# Patient Record
Sex: Male | Born: 2000 | Race: White | Hispanic: No | Marital: Single | Smoking: Never smoker
Health system: Southern US, Community
[De-identification: ages and names within clinical notes are randomized; demographics above are authoritative.]

## PROBLEM LIST (undated history)

## (undated) DIAGNOSIS — F329 Major depressive disorder, single episode, unspecified: Secondary | ICD-10-CM

## (undated) HISTORY — PX: NO PAST SURGERIES: SHX2092

---
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
## 2018-12-30 ENCOUNTER — Ambulatory Visit: Payer: BLUE CROSS/BLUE SHIELD | Admitting: Sports Medicine

## 2018-12-31 ENCOUNTER — Ambulatory Visit: Payer: BLUE CROSS/BLUE SHIELD | Admitting: Sports Medicine

## 2018-12-31 ENCOUNTER — Encounter: Payer: Self-pay | Admitting: Sports Medicine

## 2018-12-31 DIAGNOSIS — F32A Depression, unspecified: Secondary | ICD-10-CM | POA: Insufficient documentation

## 2018-12-31 DIAGNOSIS — F419 Anxiety disorder, unspecified: Secondary | ICD-10-CM

## 2018-12-31 DIAGNOSIS — F329 Major depressive disorder, single episode, unspecified: Secondary | ICD-10-CM | POA: Diagnosis not present

## 2018-12-31 HISTORY — DX: Anxiety disorder, unspecified: F41.9

## 2018-12-31 HISTORY — DX: Depression, unspecified: F32.A

## 2018-12-31 MED ORDER — SERTRALINE HCL 25 MG PO TABS: 25.0000 mg | ORAL_TABLET | Freq: Every day | ORAL | 2 refills | Status: DC

## 2018-12-31 NOTE — Assessment & Plan Note (Addendum)
We discussed in detail the pathophysiology of depression, the mechanism of SSRIs. And behavioral therapy, Zoloft 25. Return to see me in 1 month.

## 2018-12-31 NOTE — Progress Notes (Signed)
  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  Over the past few months he is noted increasing symptoms of depressed mood, uneasiness, anxiety.  No suicidal or homicidal ideation.  Symptoms are moderate, persistent.  He is agreeable to consider behavioral therapy and pharmacotherapy.  His mother accompanies him in the appointment.  I reviewed the past medical history, family history, social history, surgical history, and allergies today and no changes were needed.  Please see the problem list section below in epic for further details.  Past Medical History: No past medical history on file. Past Surgical History: No past surgical history on file. Social History: Social History   Socioeconomic History  . Marital status: Single    Spouse name: Not on file  . Number of children: Not on file  . Years of education: Not on file  . Highest education level: Not on file  Occupational History  . Not on file  Social Needs  . Financial resource strain: Not on file  . Food insecurity:    Worry: Not on file    Inability: Not on file  . Transportation needs:    Medical: Not on file    Non-medical: Not on file  Tobacco Use  . Smoking status: Never Smoker  . Smokeless tobacco: Never Used  Substance and Sexual Activity  . Alcohol use: No  . Drug use: No  . Sexual activity: Not on file  Lifestyle  . Physical activity:    Days per week: Not on file    Minutes per session: Not on file  . Stress: Not on file  Relationships  . Social connections:    Talks on phone: Not on file    Gets together: Not on file    Attends religious service: Not on file    Active member of club or organization: Not on file    Attends meetings of clubs or organizations: Not on file    Relationship status: Not on file  Other Topics Concern  . Not on file  Social History Narrative  . Not on file   Family  History: No family history on file. Allergies: No Known Allergies Medications: See med rec.  Review of Systems: No fevers, chills, night sweats, weight loss, chest pain, or shortness of breath.   Objective:    General: Well Developed, well nourished, and in no acute distress.  Neuro: Alert and oriented x3, extra-ocular muscles intact, sensation grossly intact.  HEENT: Normocephalic, atraumatic, pupils equal round reactive to light, neck supple, no masses, no lymphadenopathy, thyroid nonpalpable.  Skin: Warm and dry, no rashes. Cardiac: Regular rate and rhythm, no murmurs rubs or gallops, no lower extremity edema.  Respiratory: Clear to auscultation bilaterally. Not using accessory muscles, speaking in full sentences.  Impression and Recommendations:    Anxiety and depression We discussed in detail the pathophysiology of depression, the mechanism of SSRIs. And behavioral therapy, Zoloft 25. Return to see me in 1 month.  ___________________________________________ Ihor Austin. Jonathan Moreno, M.D., ABFM., CAQSM. Primary Care and Sports Medicine [facility]  Adjunct Professor of Family Medicine  [employer]

## 2019-01-30 ENCOUNTER — Ambulatory Visit (INDEPENDENT_AMBULATORY_CARE_PROVIDER_SITE_OTHER): Payer: BLUE CROSS/BLUE SHIELD | Admitting: Sports Medicine

## 2019-01-30 ENCOUNTER — Other Ambulatory Visit: Payer: Self-pay

## 2019-01-30 DIAGNOSIS — F329 Major depressive disorder, single episode, unspecified: Secondary | ICD-10-CM | POA: Diagnosis not present

## 2019-01-30 DIAGNOSIS — F32A Depression, unspecified: Secondary | ICD-10-CM

## 2019-01-30 DIAGNOSIS — F419 Anxiety disorder, unspecified: Secondary | ICD-10-CM

## 2019-01-30 MED ORDER — SERTRALINE HCL 50 MG PO TABS: 50.0000 mg | ORAL_TABLET | Freq: Every day | ORAL | 3 refills | Status: DC

## 2019-01-30 NOTE — Progress Notes (Signed)
Virtual Visit via WebEx   I connected with  Jonathan Moreno  on 01/30/19 via WebEx and verified that I am speaking with the correct person using two identifiers.   I discussed the limitations, risks, security and privacy concerns of performing an evaluation and management service by WebEx, including the higher likelihood of inaccurate diagnosis and treatment, and the availability of in person appointments.  We also discussed the likely need of an additional face to face encounter for complete and high quality delivery of care.  I also discussed with the patient that there may be a patient responsible charge related to this service. The patient expressed understanding and wishes to proceed.  Subjective:    CC: Follow-up anxiety and depression  HPI: Jonathan Moreno is a pleasant 18 year old male, he is in college.  We have been treating him for anxiety and depression, he did really well with 25 of sertraline.  Good improvements in depressive symptoms but persistent uneasiness and anxiety.  This is complicated with the passing of his grandfather recently as well as being out of school due to the COVID-19 drama.  He denies suicidal or homicidal ideation.  I reviewed the past medical history, family history, social history, surgical history, and allergies today and no changes were needed.  Please see the problem list section below in epic for further details.  Past Medical History: No past medical history on file. Past Surgical History: No past surgical history on file. Social History: Social History   Socioeconomic History  . Marital status: Single    Spouse name: Not on file  . Number of children: Not on file  . Years of education: Not on file  . Highest education level: Not on file  Occupational History  . Not on file  Social Needs  . Financial resource strain: Not on file  . Food insecurity:    Worry: Not on file    Inability: Not on file  . Transportation needs:    Medical: Not on file    Non-medical: Not on file  Tobacco Use  . Smoking status: Never Smoker  . Smokeless tobacco: Never Used  Substance and Sexual Activity  . Alcohol use: No  . Drug use: No  . Sexual activity: Not on file  Lifestyle  . Physical activity:    Days per week: Not on file    Minutes per session: Not on file  . Stress: Not on file  Relationships  . Social connections:    Talks on phone: Not on file    Gets together: Not on file    Attends religious service: Not on file    Active member of club or organization: Not on file    Attends meetings of clubs or organizations: Not on file    Relationship status: Not on file  Other Topics Concern  . Not on file  Social History Narrative  . Not on file   Family History: No family history on file. Allergies: No Known Allergies Medications: See med rec.  Review of Systems: No fevers, chills, night sweats, weight loss, chest pain, or shortness of breath.   Objective:    General: Speaking full sentences, no audible heavy breathing.  Sounds alert and appropriately interactive.  Appears well.  Face symmetric.  Extraocular movements intact.  Pupils equal and round.  No nasal flaring or accessory muscle use visualized.  No other physical exam performed due to the non-physical nature of this visit.  Impression and Recommendations:    Anxiety and  depression Doing significantly better on Zoloft 25. He still has some anxiety symptoms, depression is feeling much better controlled. No suicidal or homicidal ideation. Increasing Zoloft to 50 mg, he did have some recent stressors including the passing of his grandfather. School is out due to COVID-19. I have also advised him to get out and do at least 30 minutes of moderate intensity aerobic activity at least daily since he is out of school. WebEx visit in 1 month for repeat PHQ and GAD.  I discussed the above assessment and treatment plan with the patient. The patient was provided an opportunity to ask  questions and all were answered. The patient agreed with the plan and demonstrated an understanding of the instructions.   The patient was advised to call back or seek an in-person evaluation if the symptoms worsen or if the condition fails to improve as anticipated.   I provided 20 minutes of electronic video evaluation time during this encounter, less than 50% was time needed to gather information, review chart and records, explain the treatment plan to the patient, and complete documentation.   ___________________________________________ Ihor Austin. Benjamin Stain, M.D., ABFM., CAQSM. Primary Care and Sports Medicine [facility]  Adjunct Professor of Family Medicine  [employer]

## 2019-01-30 NOTE — Assessment & Plan Note (Signed)
Doing significantly better on Zoloft 25. He still has some anxiety symptoms, depression is feeling much better controlled. No suicidal or homicidal ideation. Increasing Zoloft to 50 mg, he did have some recent stressors including the passing of his grandfather. School is out due to COVID-19. I have also advised him to get out and do at least 30 minutes of moderate intensity aerobic activity at least daily since he is out of school. WebEx visit in 1 month for repeat PHQ and GAD.

## 2019-03-03 ENCOUNTER — Telehealth (INDEPENDENT_AMBULATORY_CARE_PROVIDER_SITE_OTHER): Payer: BLUE CROSS/BLUE SHIELD | Admitting: Sports Medicine

## 2019-03-03 ENCOUNTER — Encounter: Payer: Self-pay | Admitting: Sports Medicine

## 2019-03-03 DIAGNOSIS — F329 Major depressive disorder, single episode, unspecified: Secondary | ICD-10-CM | POA: Diagnosis not present

## 2019-03-03 DIAGNOSIS — F419 Anxiety disorder, unspecified: Secondary | ICD-10-CM

## 2019-03-03 MED ORDER — BUSPIRONE HCL 5 MG PO TABS: 5.0000 mg | ORAL_TABLET | Freq: Three times a day (TID) | ORAL | 2 refills | Status: DC

## 2019-03-03 NOTE — Progress Notes (Signed)
Virtual Visit via WebEx/MyChart   I connected with  Jonathan Moreno  on 03/03/19 via WebEx/MyChart/Doximity Video and verified that I am speaking with the correct person using two identifiers.   I discussed the limitations, risks, security and privacy concerns of performing an evaluation and management service by WebEx/MyChart/Doximity Video, including the higher likelihood of inaccurate diagnosis and treatment, and the availability of in person appointments.  We also discussed the likely need of an additional face to face encounter for complete and high quality delivery of care.  I also discussed with the patient that there may be a patient responsible charge related to this service. The patient expressed understanding and wishes to proceed.  Provider location is either at home or medical facility. Patient location is at their home, different from provider location. People involved in care of the patient during this telehealth encounter were myself, my nurse/medical assistant, and my front office/scheduling team member.  Subjective:    CC: Follow-up  HPI: This is a pleasant 18 year old male, we have been treating him for anxiety and depression, at the last visit we bumped him up to 50 mg of Zoloft, he feels as though his depressive symptoms have resolved, he still gets significant anxiety on a daily basis, has a constant, baseline level of feeling uncomfortable.  He is agreeable to add behavioral therapy to the treatment regimen as well as add something for anxiety.  He did not seem excited to go up on the Zoloft dose.  No suicidal or homicidal ideation.  He was at [employer], they are still not started back.  I reviewed the past medical history, family history, social history, surgical history, and allergies today and no changes were needed.  Please see the problem list section below in epic for further details.  Past Medical History: No past medical history on file. Past Surgical  History: No past surgical history on file. Social History: Social History   Socioeconomic History  . Marital status: Single    Spouse name: Not on file  . Number of children: Not on file  . Years of education: Not on file  . Highest education level: Not on file  Occupational History  . Not on file  Social Needs  . Financial resource strain: Not on file  . Food insecurity:    Worry: Not on file    Inability: Not on file  . Transportation needs:    Medical: Not on file    Non-medical: Not on file  Tobacco Use  . Smoking status: Never Smoker  . Smokeless tobacco: Never Used  Substance and Sexual Activity  . Alcohol use: No  . Drug use: No  . Sexual activity: Not on file  Lifestyle  . Physical activity:    Days per week: Not on file    Minutes per session: Not on file  . Stress: Not on file  Relationships  . Social connections:    Talks on phone: Not on file    Gets together: Not on file    Attends religious service: Not on file    Active member of club or organization: Not on file    Attends meetings of clubs or organizations: Not on file    Relationship status: Not on file  Other Topics Concern  . Not on file  Social History Narrative  . Not on file   Family History: No family history on file. Allergies: No Known Allergies Medications: See med rec.  Review of Systems: No fevers,  chills, night sweats, weight loss, chest pain, or shortness of breath.   Objective:    General: Speaking full sentences, no audible heavy breathing.  Sounds alert and appropriately interactive.  Appears well.  Face symmetric.  Extraocular movements intact.  Pupils equal and round.  No nasal flaring or accessory muscle use visualized.  No other physical exam performed due to the non-physical nature of this visit.  Impression and Recommendations:    Anxiety and depression Depressive symptoms improved further on Zoloft 50 mg, he tells me his depressive symptoms have essentially  resolved. He does still get anxiety symptoms, daily basis, no panic, but a baseline level of uneasiness. He does not really want to go up on the Zoloft, I am going to add BuSpar 5 mg 3 times daily. I would also like to set him up with [facility] for a virtual visit.  I discussed the above assessment and treatment plan with the patient. The patient was provided an opportunity to ask questions and all were answered. The patient agreed with the plan and demonstrated an understanding of the instructions.   The patient was advised to call back or seek an in-person evaluation if the symptoms worsen or if the condition fails to improve as anticipated.   I provided 25 minutes of non-face-to-face time during this encounter, 15 minutes of additional time was needed to gather information, review chart, records, communicate/coordinate with staff remotely, troubleshooting the multiple errors that we get every time when trying to do video calls through the electronic medical record, WebEx, and Doximity, restart the encounter multiple times due to instability of the software, as well as complete documentation.   ___________________________________________ Ihor Austin. Benjamin Stain, M.D., ABFM., CAQSM. Primary Care and Sports Medicine [facility]  Adjunct Professor of Family Medicine  [employer]

## 2019-03-03 NOTE — Assessment & Plan Note (Signed)
Depressive symptoms improved further on Zoloft 50 mg, he tells me his depressive symptoms have essentially resolved. He does still get anxiety symptoms, daily basis, no panic, but a baseline level of uneasiness. He does not really want to go up on the Zoloft, I am going to add BuSpar 5 mg 3 times daily. I would also like to set him up with [facility] for a virtual visit.

## 2019-05-28 ENCOUNTER — Other Ambulatory Visit: Payer: Self-pay

## 2019-05-28 ENCOUNTER — Encounter: Payer: Self-pay | Admitting: Family Medicine

## 2019-05-28 ENCOUNTER — Ambulatory Visit (INDEPENDENT_AMBULATORY_CARE_PROVIDER_SITE_OTHER): Payer: BC Managed Care – PPO | Admitting: Family Medicine

## 2019-05-28 VITALS — BP 104/62 | HR 59 | Temp 97.8°F | Wt 192.0 lb

## 2019-05-28 DIAGNOSIS — L0291 Cutaneous abscess, unspecified: Secondary | ICD-10-CM

## 2019-05-28 MED ORDER — DOXYCYCLINE HYCLATE 100 MG PO TABS: 100.0000 mg | ORAL_TABLET | Freq: Two times a day (BID) | ORAL | 0 refills | Status: DC

## 2019-05-28 NOTE — Progress Notes (Signed)
       [place] Moreno is a 18 y.o. male who presents to [facility]: [facility] today for abscess.  Patient had a small bump on the right medial thigh near his groin about a month ago that resolved spontaneously.  Still about a week ago the bump return to become red and painful.  He is able to get it to drain yesterday and this is a lot better today.  Is not draining.  He denies significant pain.  No fevers chills nausea vomiting or diarrhea.   ROS as above:  Exam:  BP 104/62   Pulse (!) 59   Temp 97.8 F (36.6 C) (Oral)   Wt 192 lb (87.1 kg)   BMI 26.41 kg/m  Wt Readings from Last 5 Encounters:  05/28/19 192 lb (87.1 kg)  12/31/18 182 lb (82.6 kg)  05/24/17 178 lb (80.7 kg) (83 %, Z= 0.96)*  09/25/16 179 lb (81.2 kg) (86 %, Z= 1.08)*  08/18/16 174 lb 11.2 oz (79.2 kg) (84 %, Z= 0.98)*   * Growth percentiles are based on CDC (Boys, 2-20 Years) data.    Gen: Well NAD HEENT: EOMI,  MMM Lungs: Normal work of breathing. CTABL Heart: RRR no MRG Abd: NABS, Soft. Nondistended, Nontender Exts: Brisk capillary refill, warm and well perfused. Skin: Right medial thigh small soft area with mild erythema.  Minimally tender.  No expressible pus.  Lab and Radiology Results No results found for this or any previous visit (from the past 72 hour(s)). No results found.    Assessment and Plan: 18 y.o. male with abscess right medial thigh.  Drained spontaneously.  No current fluid collection.  Discussed options.  Plan for trial of oral doxycycline.  If it returns will return to clinic for further incision and drainage however right now is very small and I think we can get away with just oral antibiotics.  Discussed shocking precautions including sun protection and avoiding calcium and iron.  PDMP not reviewed this encounter. No orders of the defined types were placed in this encounter.  Meds  ordered this encounter  Medications  . doxycycline (VIBRA-TABS) 100 MG tablet    Sig: Take 1 tablet (100 mg total) by mouth 2 (two) times daily.    Dispense:  14 tablet    Refill:  0     Historical information moved to improve visibility of documentation.  Past Medical History:  Diagnosis Date  . Anxiety and depression 12/31/2018   12/31/2018 PHQ = 16, GAD 7 = 16 01/30/2019 PHQ = 9, GAD 7 = 13 03/03/2019 PHQ = 5, GAD 7 = 18   No past surgical history on file. Social History   Tobacco Use  . Smoking status: Never Smoker  . Smokeless tobacco: Never Used  Substance Use Topics  . Alcohol use: No   family history is not on file.  Medications: Current Outpatient Medications  Medication Sig Dispense Refill  . busPIRone (BUSPAR) 5 MG tablet Take 1 tablet (5 mg total) by mouth 3 (three) times daily. 90 tablet 2  . doxycycline (VIBRA-TABS) 100 MG tablet Take 1 tablet (100 mg total) by mouth 2 (two) times daily. 14 tablet 0  . sertraline (ZOLOFT) 50 MG tablet Take 1 tablet (50 mg total) by mouth daily. 30 tablet 3   No current facility-administered medications for this visit.    No Known Allergies   Discussed warning signs or symptoms. Please see discharge instructions. Patient expresses understanding.

## 2019-05-28 NOTE — Patient Instructions (Signed)
Thank you for coming in today. Take doxycycline twice daily for 1 week.  Use warm compress if it starts to return.  If it just come back return to clinic and we will drain it.  Recheck as needed.  Make sure to use sun protection and avoid calcium and iron with doxycycline.    Skin Abscess  A skin abscess is an infected area on or under your skin that contains a collection of pus and other material. An abscess may also be called a furuncle, carbuncle, or boil. An abscess can occur in or on almost any part of your body. Some abscesses break open (rupture) on their own. Most continue to get worse unless they are treated. The infection can spread deeper into the body and eventually into your blood, which can make you feel ill. Treatment usually involves draining the abscess. What are the causes? An abscess occurs when germs, like bacteria, pass through your skin and cause an infection. This may be caused by:  A scrape or cut on your skin.  A puncture wound through your skin, including a needle injection or insect bite.  Blocked oil or sweat glands.  Blocked and infected hair follicles.  A cyst that forms beneath your skin (sebaceous cyst) and becomes infected. What increases the risk? This condition is more likely to develop in people who:  Have a weak body defense system (immune system).  Have diabetes.  Have dry and irritated skin.  Get frequent injections or use illegal IV drugs.  Have a foreign body in a wound, such as a splinter.  Have problems with their lymph system or veins. What are the signs or symptoms? Symptoms of this condition include:  A painful, firm bump under the skin.  A bump with pus at the top. This may break through the skin and drain. Other symptoms include:  Redness surrounding the abscess site.  Warmth.  Swelling of the lymph nodes (glands) near the abscess.  Tenderness.  A sore on the skin. How is this diagnosed? This condition may be  diagnosed based on:  A physical exam.  Your medical history.  A sample of pus. This may be used to find out what is causing the infection.  Blood tests.  Imaging tests, such as an ultrasound, CT scan, or MRI. How is this treated? A small abscess that drains on its own may not need treatment. Treatment for larger abscesses may include:  Moist heat or heat pack applied to the area several times a day.  A procedure to drain the abscess (incision and drainage).  Antibiotic medicines. For a severe abscess, you may first get antibiotics through an IV and then change to antibiotics by mouth. Follow these instructions at home: Medicines   Take over-the-counter and prescription medicines only as told by your health care provider.  If you were prescribed an antibiotic medicine, take it as told by your health care provider. Do not stop taking the antibiotic even if you start to feel better. Abscess care   If you have an abscess that has not drained, apply heat to the affected area. Use the heat source that your health care provider recommends, such as a moist heat pack or a heating pad. ? Place a towel between your skin and the heat source. ? Leave the heat on for 20-30 minutes. ? Remove the heat if your skin turns bright red. This is especially important if you are unable to feel pain, heat, or cold. You may have a  greater risk of getting burned.  Follow instructions from your health care provider about how to take care of your abscess. Make sure you: ? Cover the abscess with a bandage (dressing). ? Change your dressing or gauze as told by your health care provider. ? Wash your hands with soap and water before you change the dressing or gauze. If soap and water are not available, use hand sanitizer.  Check your abscess every day for signs of a worsening infection. Check for: ? More redness, swelling, or pain. ? More fluid or blood. ? Warmth. ? More pus or a bad smell. General  instructions  To avoid spreading the infection: ? Do not share personal care items, towels, or hot tubs with others. ? Avoid making skin contact with other people.  Keep all follow-up visits as told by your health care provider. This is important. Contact a health care provider if you have:  More redness, swelling, or pain around your abscess.  More fluid or blood coming from your abscess.  Warm skin around your abscess.  More pus or a bad smell coming from your abscess.  A fever.  Muscle aches.  Chills or a general ill feeling. Get help right away if you:  Have severe pain.  See red streaks on your skin spreading away from the abscess. Summary  A skin abscess is an infected area on or under your skin that contains a collection of pus and other material.  A small abscess that drains on its own may not need treatment.  Treatment for larger abscesses may include having a procedure to drain the abscess and taking an antibiotic. This information is not intended to replace advice given to you by your health care provider. Make sure you discuss any questions you have with your health care provider. Document Released: 07/26/2005 Document Revised: 02/06/2019 Document Reviewed: 11/29/2017 Elsevier Patient Education  2020 [employer].

## 2019-09-23 ENCOUNTER — Telehealth: Payer: Self-pay | Admitting: Sports Medicine

## 2019-09-23 ENCOUNTER — Ambulatory Visit: Payer: BC Managed Care – PPO | Admitting: Sports Medicine

## 2019-09-23 NOTE — Telephone Encounter (Signed)
Patient mom and patient is aware. Appointment has been scheduled. -HSM.

## 2019-09-23 NOTE — Telephone Encounter (Signed)
Patient's mom called and the patient is having some back pain. He reports that it started Saturday. He has taken some Ibuprofen and is not helping much. He was offered an appointment for next week and does not want to wait that long. I offered Urgent Care or ER and patient and mom were insistent on seeing you. Please advise.

## 2019-09-23 NOTE — Telephone Encounter (Signed)
I think we can book him somewhere tomorrow, may be 115?

## 2019-09-24 ENCOUNTER — Ambulatory Visit (INDEPENDENT_AMBULATORY_CARE_PROVIDER_SITE_OTHER): Payer: BC Managed Care – PPO

## 2019-09-24 ENCOUNTER — Other Ambulatory Visit: Payer: Self-pay

## 2019-09-24 ENCOUNTER — Encounter: Payer: Self-pay | Admitting: Sports Medicine

## 2019-09-24 ENCOUNTER — Ambulatory Visit: Payer: BC Managed Care – PPO | Admitting: Sports Medicine

## 2019-09-24 ENCOUNTER — Ambulatory Visit (INDEPENDENT_AMBULATORY_CARE_PROVIDER_SITE_OTHER): Payer: BC Managed Care – PPO | Admitting: Sports Medicine

## 2019-09-24 DIAGNOSIS — M545 Low back pain: Secondary | ICD-10-CM | POA: Diagnosis not present

## 2019-09-24 DIAGNOSIS — M5416 Radiculopathy, lumbar region: Secondary | ICD-10-CM

## 2019-09-24 MED ORDER — PREDNISONE 50 MG PO TABS: ORAL_TABLET | ORAL | 0 refills | Status: DC

## 2019-09-24 MED ORDER — MELOXICAM 15 MG PO TABS: ORAL_TABLET | ORAL | 3 refills | Status: DC

## 2019-09-24 NOTE — Assessment & Plan Note (Signed)
Axial discogenic back pain with left-sided L4 radicular symptoms. X-rays, prednisone, meloxicam, formal PT. Return to see me in 4 weeks, MR for interventional planning if no better.

## 2019-09-24 NOTE — Progress Notes (Signed)
Subjective:    CC: Back pain  HPI: For a few weeks after doing some yard work.  Has had pain that he localizes in the left lower lumbar spine with radiation over to the anterolateral left thigh but not past the knee.  Worse with sitting, flexion, Valsalva, no bowel or bladder dysfunction, saddle numbness, constitutional symptoms, trauma.  I reviewed the past medical history, family history, social history, surgical history, and allergies today and no changes were needed.  Please see the problem list section below in epic for further details.  Past Medical History: Past Medical History:  Diagnosis Date  . Anxiety and depression 12/31/2018   12/31/2018 PHQ = 16, GAD 7 = 16 01/30/2019 PHQ = 9, GAD 7 = 13 03/03/2019 PHQ = 5, GAD 7 = 18   Past Surgical History: No past surgical history on file. Social History: Social History   Socioeconomic History  . Marital status: Single    Spouse name: Not on file  . Number of children: Not on file  . Years of education: Not on file  . Highest education level: Not on file  Occupational History  . Not on file  Social Needs  . Financial resource strain: Not on file  . Food insecurity    Worry: Not on file    Inability: Not on file  . Transportation needs    Medical: Not on file    Non-medical: Not on file  Tobacco Use  . Smoking status: Never Smoker  . Smokeless tobacco: Never Used  Substance and Sexual Activity  . Alcohol use: No  . Drug use: No  . Sexual activity: Not on file  Lifestyle  . Physical activity    Days per week: Not on file    Minutes per session: Not on file  . Stress: Not on file  Relationships  . Social Herbalist on phone: Not on file    Gets together: Not on file    Attends religious service: Not on file    Active member of club or organization: Not on file    Attends meetings of clubs or organizations: Not on file    Relationship status: Not on file  Other Topics Concern  . Not on file  Social History  Narrative  . Not on file   Family History: No family history on file. Allergies: No Known Allergies Medications: See med rec.  Review of Systems: No fevers, chills, night sweats, weight loss, chest pain, or shortness of breath.   Objective:    General: Well Developed, well nourished, and in no acute distress.  Neuro: Alert and oriented x3, extra-ocular muscles intact, sensation grossly intact.  HEENT: Normocephalic, atraumatic, pupils equal round reactive to light, neck supple, no masses, no lymphadenopathy, thyroid nonpalpable.  Skin: Warm and dry, no rashes. Cardiac: Regular rate and rhythm, no murmurs rubs or gallops, no lower extremity edema.  Respiratory: Clear to auscultation bilaterally. Not using accessory muscles, speaking in full sentences. Back Exam:  Inspection: Unremarkable  Motion: Flexion 45 deg, Extension 45 deg, Side Bending to 45 deg bilaterally,  Rotation to 45 deg bilaterally  SLR laying: Negative  XSLR laying: Negative  Palpable tenderness: None. FABER: negative. Sensory change: Gross sensation intact to all lumbar and sacral dermatomes.  Reflexes: 2+ at both patellar tendons, 2+ at achilles tendons, Babinski's downgoing.  Strength at foot  Plantar-flexion: 5/5 Dorsi-flexion: 5/5 Eversion: 5/5 Inversion: 5/5  Leg strength  Quad: 5/5 Hamstring: 5/5 Hip flexor: 5/5 Hip  abductors: 5/5  Gait unremarkable.  Impression and Recommendations:    Left lumbar radiculitis Axial discogenic back pain with left-sided L4 radicular symptoms. X-rays, prednisone, meloxicam, formal PT. Return to see me in 4 weeks, MR for interventional planning if no better.   ___________________________________________ Ihor Austin. Benjamin Stain, M.D., ABFM., CAQSM. Primary Care and Sports Medicine [facility]  Adjunct Professor of Family Medicine  [employer]

## 2019-10-01 ENCOUNTER — Ambulatory Visit: Payer: BC Managed Care – PPO | Admitting: Sports Medicine

## 2019-10-06 ENCOUNTER — Ambulatory Visit (INDEPENDENT_AMBULATORY_CARE_PROVIDER_SITE_OTHER): Payer: BC Managed Care – PPO | Admitting: Rehabilitative and Restorative Service Providers"

## 2019-10-06 ENCOUNTER — Other Ambulatory Visit: Payer: Self-pay

## 2019-10-06 DIAGNOSIS — M5440 Lumbago with sciatica, unspecified side: Secondary | ICD-10-CM | POA: Diagnosis not present

## 2019-10-06 DIAGNOSIS — R29898 Other symptoms and signs involving the musculoskeletal system: Secondary | ICD-10-CM | POA: Diagnosis not present

## 2019-10-06 DIAGNOSIS — M6281 Muscle weakness (generalized): Secondary | ICD-10-CM

## 2019-10-06 NOTE — Patient Instructions (Signed)
  Access Code: [email or web address]  URL: [email or web address]  Date: 10/06/2019  Prepared by: Lafferty Lumbar Extension Press Up - 10 reps - 1 sets - 5 seconds hold - 2x daily - 7x weekly Seated Table Hamstring Stretch - 3 reps - 1 sets - 30 seconds hold - 2x daily - 7x weekly Seated Piriformis Stretch with Trunk Bend - 3 reps - 1 sets - 30 seconds hold - 2x daily - 7x weekly Wall Squat - 10 reps - 2 sets - 5 seconds hold - 2x daily - 7x weekly

## 2019-10-06 NOTE — Therapy (Signed)
[facility] [address] [place], [place], [address] Phone: [phone]   Fax:  [phone]  Physical Therapy Evaluation  Patient Details  Name: [place] Moreno MRN: [id number] Date of Birth: 2001-02-15 Referring Provider (PT): Monica Bectonhekkekandam, Thomas J, MD   Encounter Date: 10/06/2019  PT End of Session - 10/06/19 2205    Visit Number  1    Number of Visits  4    Date for PT Re-Evaluation  11/05/19    Authorization Type  [employer] $150 copay/visit    PT Start Time  1430    PT Stop Time  1518    PT Time Calculation (min)  48 min    Activity Tolerance  Patient tolerated treatment well;No increased pain    Behavior During Therapy  WFL for tasks assessed/performed       Past Medical History:  Diagnosis Date  . Anxiety and depression 12/31/2018   12/31/2018 PHQ = 16, GAD 7 = 16 01/30/2019 PHQ = 9, GAD 7 = 13 03/03/2019 PHQ = 5, GAD 7 = 18    No past surgical history on file.  There were no vitals filed for this visit.   Subjective Assessment - 10/06/19 1439    Subjective  The patient had sudden onset of pain 8 months ago after falling asleep propped on arm of couch.  That pain radiated and lasted x 3 days and cleared.  He raked leaves over the prior weekend and developed sciatic nerve pain to his knee.  He was started on prednisone and his pain has cleared.    Pertinent History  --    Patient Stated Goals  get exercises to manage back pain.    Currently in Pain?  No/denies   not since prednisone        [facility] PT Assessment - 10/06/19 1447      Assessment   Medical Diagnosis  M54.16 (ICD-10-CM) - Left lumbar radiculitis    Referring Provider (PT)  Monica Bectonhekkekandam, Thomas J, MD    Onset Date/Surgical Date  09/24/19    Prior Therapy  none      Restrictions   Weight Bearing Restrictions  No      Balance Screen   Has the patient fallen in the past 6 months  No    Has the patient had a decrease in activity level because of a fear  of falling?   No    Is the patient reluctant to leave their home because of a fear of falling?   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     Prior Function   Level of Independence  Independent      Observation/Other Assessments   Focus on Therapeutic Outcomes (FOTO)   61%      Sensation   Light Touch  Appears Intact      Posture/Postural Control   Posture/Postural Control  No significant limitations      ROM / Strength   AROM / PROM / Strength  AROM;Strength      AROM   Overall AROM   Deficits    AROM Assessment Site  Lumbar    Lumbar Flexion  WFL   bends L knee and keeps R knee straight   Lumbar Extension  WFL    Lumbar - Right Side Bend  WFL   has L low back pain  Lumbar - Left Side Bend  WFL    Lumbar - Right Rotation  [facility]   feels tightness on the right   Lumbar - Left Rotation  [facility]      Strength   Overall Strength  Deficits    Strength Assessment Site  Hip;Knee;Ankle    Right/Left Hip  Right;Left    Right Hip Flexion  4+/5   has some discomfort in R low back    Right Hip Extension  5/5    Right Hip ABduction  5/5    Left Hip Flexion  5/5   shakiness with MMT   Left Hip Extension  5/5    Left Hip ABduction  5/5    Right/Left Knee  Right;Left    Right Knee Flexion  5/5    Right Knee Extension  5/5    Left Knee Flexion  4+/5    Left Knee Extension  4+/5   has general shakiness   Right/Left Ankle  Right;Left    Right Ankle Dorsiflexion  5/5    Left Ankle Dorsiflexion  5/5      Flexibility   Soft Tissue Assessment /Muscle Length  yes    Hamstrings  -30 degrees from neutral (90/90 starting position)    Piriformis  L piriformis stretching reproduces pain, R side is tolerable    Quadratus Lumborum  mild tenderness to palpation on the left side      Palpation   Palpation comment  tender over L PSIS, L quadratus lumborum      Special Tests    Special Tests  --                 Objective measurements completed on examination: See above findings.      [facility] Adult PT Treatment/Exercise - 10/06/19 1447      Exercises   Exercises  Lumbar      Lumbar Exercises: Stretches   Active Hamstring Stretch  Right;Left;2 reps;30 seconds    Press Ups  10 reps    Piriformis Stretch  Right;Left;2 reps;30 seconds      Lumbar Exercises: Quadruped   Opposite Arm/Leg Raise  5 reps    Opposite Arm/Leg Raise Limitations  Needs cues to maintain neutral spine.             PT Education - 10/06/19 2219    Education Details  HEP and online resources :  foundation training free video and yoga with adriene.  Discussed strategies to ensure doing correctly (videoing some of the exercises to look at technique)    Person(s) Educated  Patient    Methods  Explanation;Demonstration;Handout    Comprehension  Returned demonstration;Verbalized understanding          PT Long Term Goals - 10/06/19 2206      PT LONG TERM GOAL #1   Title  The patient will be indep with HEP for lumbar stabilization, flexibility, and LE strengthening.    Time  4    Period  Weeks    Target Date  11/05/19      PT LONG TERM GOAL #2   Title  The patient will improve LE strength to 5/5.    Time  4    Period  Weeks    Target Date  11/05/19      PT LONG TERM GOAL #3   Title  The patient will verbalize understanding of online resources for ongoing exercise for lumbar stabilization (due to high copay).    Time  4  Period  Weeks    Target Date  11/05/19             Plan - 10/06/19 2209    Clinical Impression Statement  The patient is a 18 year old male presenting to OP physical therapy for evaluation of left lumbar radiculitis. Pain has diminished with medications, however he notes continued sensation of weakness and desire to learn ther ex to strengthen back.  He has some pain today during R lateral flexion and R rotation in L low back.  He has pain to palpation along L  lateral sacral border and L PSIS and weakness is noted during quadriped stabiization and hip flexion.  PT initiated HEP to address flexibility in LEs, begin strengthening.  Due to very high copay, the patient inquired about other resources.  Scheduled 1 visit for f/u to use if needed.    Examination-Activity Limitations  Lift    Stability/Clinical Decision Making  Stable/Uncomplicated    Clinical Decision Making  Low    Rehab Potential  Good    PT Frequency  1x / week    PT Duration  4 weeks    PT Treatment/Interventions  ADLs/Self Care Home Management;Gait training;Neuromuscular re-education;Patient/family education;Therapeutic activities;Therapeutic exercise;Manual techniques;Dry needling;Electrical Stimulation;Moist Heat;Traction    PT Next Visit Plan  patient to try online resources + HEP provided.  Will return to updated program if continuing with symptoms.    Consulted and Agree with Plan of Care  Patient       Patient will benefit from skilled therapeutic intervention in order to improve the following deficits and impairments:  Pain, Impaired flexibility, Decreased strength  Visit Diagnosis: Low back pain with sciatica, sciatica laterality unspecified, unspecified back pain laterality, unspecified chronicity  Muscle weakness (generalized)  Other symptoms and signs involving the musculoskeletal system     Problem List Patient Active Problem List   Diagnosis Date Noted  . Left lumbar radiculitis 09/24/2019  . Anxiety and depression 12/31/2018  . Pain of left patellofemoral joint 05/24/2017  . Left ankle sprain 07/21/2016  . Travel advice encounter 12/24/2014  . Epistaxis 12/24/2014  . Hyperhidrosis 10/13/2013    Javoris Star, PT 10/06/2019, 10:22 PM  [facility] [place], [place], [address] Phone: [phone]   Fax:  [phone]  Name: [place] Moreno MRN: [id number] Date of Birth: [birth year]

## 2019-10-20 ENCOUNTER — Encounter: Payer: Self-pay | Admitting: Rehabilitative and Restorative Service Providers"

## 2019-10-20 NOTE — Therapy (Signed)
[facility] [place] [place], [place], [address] Phone: [phone]   Fax:  [phone]  Patient Details  Name: [place] Moreno MRN: [id number] Date of Birth: [birth year] Referring Provider:  Silverio Decamp, MD  PHYSICAL THERAPY DISCHARGE SUMMARY  Visits from Start of Care: eval only  Current functional level related to goals / functional outcomes: See initial eval for patient status.  Patient private pay for PT and PT provided HEP and online resources.  Patient called to cx f/u visit stating he would continue on his own.   Remaining deficits: See eval    Education / Equipment: See eval  Plan: Patient agrees to discharge.  Patient goals were not met. Patient is being discharged due to financial reasons.  ?????        Thank you for the referral of this patient. [place] Moreno, [place] Moreno    [place] Moreno 10/20/2019, 9:54 AM  [facility] [place], [place], [address] Phone: [phone]   Fax:  [phone]

## 2019-10-22 ENCOUNTER — Ambulatory Visit: Payer: BC Managed Care – PPO | Admitting: Sports Medicine

## 2020-08-12 ENCOUNTER — Ambulatory Visit (INDEPENDENT_AMBULATORY_CARE_PROVIDER_SITE_OTHER): Payer: BC Managed Care – PPO | Admitting: Sports Medicine

## 2020-08-12 ENCOUNTER — Ambulatory Visit (INDEPENDENT_AMBULATORY_CARE_PROVIDER_SITE_OTHER): Payer: BC Managed Care – PPO

## 2020-08-12 ENCOUNTER — Other Ambulatory Visit: Payer: Self-pay

## 2020-08-12 DIAGNOSIS — S43432A Superior glenoid labrum lesion of left shoulder, initial encounter: Secondary | ICD-10-CM | POA: Diagnosis not present

## 2020-08-12 DIAGNOSIS — M25561 Pain in right knee: Secondary | ICD-10-CM | POA: Diagnosis not present

## 2020-08-12 DIAGNOSIS — M2391 Unspecified internal derangement of right knee: Secondary | ICD-10-CM | POA: Diagnosis not present

## 2020-08-12 DIAGNOSIS — Y9362 Activity, american flag or touch football: Secondary | ICD-10-CM

## 2020-08-12 DIAGNOSIS — M25512 Pain in left shoulder: Secondary | ICD-10-CM

## 2020-08-12 DIAGNOSIS — M25562 Pain in left knee: Secondary | ICD-10-CM

## 2020-08-12 DIAGNOSIS — S8992XA Unspecified injury of left lower leg, initial encounter: Secondary | ICD-10-CM | POA: Diagnosis not present

## 2020-08-12 DIAGNOSIS — M7989 Other specified soft tissue disorders: Secondary | ICD-10-CM | POA: Diagnosis not present

## 2020-08-12 MED ORDER — MELOXICAM 15 MG PO TABS: ORAL_TABLET | ORAL | 3 refills | Status: DC

## 2020-08-12 NOTE — Assessment & Plan Note (Signed)
Pain flag football, he took a misstep and was hit by another player. He had immediate pain, swelling, bruising, today he has instability on exam, I cannot appreciate his ACL, he has an effusion. MCL feels good. No apprehension sign. I am concerned he is torn his ACL. X-rays today, adding an MRI.

## 2020-08-12 NOTE — Progress Notes (Addendum)
    Procedures performed today:    None.  Independent interpretation of notes and tests performed by another provider:   None.  Brief History, Exam, Impression, and Recommendations:    Internal derangement of knee, right Pain flag football, he took a misstep and was hit by another player. He had immediate pain, swelling, bruising, today he has instability on exam, I cannot appreciate his ACL, he has an effusion. MCL feels good. No apprehension sign. I am concerned he is torn his ACL. X-rays today, adding an MRI.  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 During 1 swing he felt his shoulder pop. He had immediate pain, now he has pain with abduction, external rotation with significant mechanical symptoms, on exam he has instability as well as a strongly positive O'Brien's test. I am concerned that he has a labral tear/SLAP tear. Adding x-rays, he will need to return to see me for MR arthrography.  Update, MR arthrogram shows large posterior labral tear, referral to Dr. Everardo Pacific.  Patient does have significant shoulder instability.    ___________________________________________ Ihor Austin. Benjamin Stain, M.D., ABFM., CAQSM. Primary Care and Sports Medicine  [facility]  Adjunct Instructor of Family Medicine  [employer]

## 2020-08-12 NOTE — Assessment & Plan Note (Addendum)
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 During 1 swing he felt his shoulder pop. He had immediate pain, now he has pain with abduction, external rotation with significant mechanical symptoms, on exam he has instability as well as a strongly positive O'Brien's test. I am concerned that he has a labral tear/SLAP tear. Adding x-rays, he will need to return to see me for MR arthrography.  Update, MR arthrogram shows large posterior labral tear, referral to Dr. Everardo Pacific.  Patient does have significant shoulder instability.

## 2020-08-30 ENCOUNTER — Ambulatory Visit (INDEPENDENT_AMBULATORY_CARE_PROVIDER_SITE_OTHER): Payer: BC Managed Care – PPO | Admitting: Sports Medicine

## 2020-08-30 ENCOUNTER — Ambulatory Visit: Payer: Self-pay

## 2020-08-30 ENCOUNTER — Ambulatory Visit (INDEPENDENT_AMBULATORY_CARE_PROVIDER_SITE_OTHER): Payer: BC Managed Care – PPO

## 2020-08-30 ENCOUNTER — Other Ambulatory Visit: Payer: Self-pay

## 2020-08-30 DIAGNOSIS — Y9362 Activity, american flag or touch football: Secondary | ICD-10-CM

## 2020-08-30 DIAGNOSIS — S86911A Strain of unspecified muscle(s) and tendon(s) at lower leg level, right leg, initial encounter: Secondary | ICD-10-CM

## 2020-08-30 DIAGNOSIS — M25561 Pain in right knee: Secondary | ICD-10-CM | POA: Diagnosis not present

## 2020-08-30 DIAGNOSIS — Z8739 Personal history of other diseases of the musculoskeletal system and connective tissue: Secondary | ICD-10-CM | POA: Diagnosis not present

## 2020-08-30 DIAGNOSIS — S43432A Superior glenoid labrum lesion of left shoulder, initial encounter: Secondary | ICD-10-CM

## 2020-08-30 DIAGNOSIS — S76111A Strain of right quadriceps muscle, fascia and tendon, initial encounter: Secondary | ICD-10-CM | POA: Diagnosis not present

## 2020-08-30 NOTE — Assessment & Plan Note (Signed)
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

## 2020-08-30 NOTE — Progress Notes (Signed)
    Procedures performed today:    Procedure: Real-time Ultrasound Guided gadolinium contrast injection of left glenohumeral joint Device: Samsung HS60  Verbal informed consent obtained.  Time-out conducted.  Noted no overlying erythema, induration, or other signs of local infection.  Skin prepped in a sterile fashion.  Local anesthesia: Topical Ethyl chloride.  With sterile technique and under real time ultrasound guidance: I guided a 22-gauge spinal needle into the glenohumeral joint from a posterior approach, I then injected 1 cc kenalog 40, 2 cc lidocaine, 2 cc with a cane, syringe switched and 0.1 mL gadolinium injected, syringe again switched and 10 cc sterile saline used to distend the joint. Joint visualized and capsule seen distending confirming intra-articular placement of contrast material and medication. Completed without difficulty  Advised to call if fevers/chills, erythema, induration, drainage, or persistent bleeding.  Images permanently stored in PACS Impression: Technically successful ultrasound guided gadolinium contrast injection for MR arthrography.  Please see separate MR arthrogram report.   Independent interpretation of notes and tests performed by another provider:   None.  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   ___________________________________________ Ihor Austin. Benjamin Stain, M.D., ABFM., CAQSM. Primary Care and Sports Medicine [place] [facility]  Adjunct Instructor of Family Medicine  [employer]

## 2020-08-31 NOTE — Addendum Note (Signed)
Addended by: Monica Becton on: 08/31/2020 02:40 PM   Modules accepted: Orders

## 2020-09-09 DIAGNOSIS — M25512 Pain in left shoulder: Secondary | ICD-10-CM | POA: Diagnosis not present

## 2020-10-06 ENCOUNTER — Encounter (HOSPITAL_BASED_OUTPATIENT_CLINIC_OR_DEPARTMENT_OTHER): Payer: Self-pay | Admitting: Orthopaedic Surgery

## 2020-10-06 ENCOUNTER — Other Ambulatory Visit: Payer: Self-pay

## 2020-10-09 ENCOUNTER — Other Ambulatory Visit (HOSPITAL_COMMUNITY)
Admission: RE | Admit: 2020-10-09 | Discharge: 2020-10-09 | Disposition: A | Discharge status: Home / Self Care | Payer: BC Managed Care – PPO | Source: Ambulatory Visit | Attending: Orthopaedic Surgery | Admitting: Orthopaedic Surgery

## 2020-10-09 DIAGNOSIS — Z20822 Contact with and (suspected) exposure to covid-19: Secondary | ICD-10-CM | POA: Diagnosis not present

## 2020-10-09 DIAGNOSIS — Z01812 Encounter for preprocedural laboratory examination: Secondary | ICD-10-CM | POA: Diagnosis not present

## 2020-10-10 LAB — SARS CORONAVIRUS 2 (TAT 6-24 HRS): SARS Coronavirus 2: NEGATIVE

## 2020-10-11 NOTE — Progress Notes (Signed)

## 2020-10-12 NOTE — H&P (Signed)
PREOPERATIVE H&P  Chief Complaint: LEFT SHOULDER DISLOCATION S43.006A  HPI: Jonathan Moreno is a 19 y.o. male who is scheduled for, Procedure(s): LEFT SHOULDER ARTHROSCOPY WITH POSTERIOR BANKART REPAIR.   The patient is a healthy 41 year old who was "slap boxing" with his friend and had pain in his shoulder after.  He had no dislocation.  It has been a year or more since he had the onset of pain.  He has tried many things including time, and Dr. Karie Schwalbe did nonoperative management before sending him my way.  His symptoms are rated as moderate to severe, and have been worsening.  This is significantly impairing activities of daily living.    Please see clinic note for further details on this patient's care.    He has elected for surgical management.   Past Medical History:  Diagnosis Date  . Anxiety and depression 12/31/2018   12/31/2018 PHQ = 16, GAD 7 = 16 01/30/2019 PHQ = 9, GAD 7 = 13 03/03/2019 PHQ = 5, GAD 7 = 18   Past Surgical History:  Procedure Laterality Date  . NO PAST SURGERIES     Social History   Socioeconomic History  . Marital status: Single    Spouse name: Not on file  . Number of children: Not on file  . Years of education: Not on file  . Highest education level: Not on file  Occupational History  . Not on file  Tobacco Use  . Smoking status: Never Smoker  . Smokeless tobacco: Never Used  Vaping Use  . Vaping Use: Never used  Substance and Sexual Activity  . Alcohol use: Yes    Comment: occas  . Drug use: No  . Sexual activity: Not on file  Other Topics Concern  . Not on file  Social History Narrative  . Not on file   Social Determinants of Health   Financial Resource Strain: Not on file  Food Insecurity: Not on file  Transportation Needs: Not on file  Physical Activity: Not on file  Stress: Not on file  Social Connections: Not on file   History reviewed. No pertinent family history. No Known Allergies Prior to Admission medications   Not on File     ROS: All other systems have been reviewed and were otherwise negative with the exception of those mentioned in the HPI and as above.  Physical Exam: General: Alert, no acute distress Cardiovascular: No pedal edema Respiratory: No cyanosis, no use of accessory musculature GI: No organomegaly, abdomen is soft and non-tender Skin: No lesions in the area of chief complaint Neurologic: Sensation intact distally Psychiatric: Patient is competent for consent with normal mood and affect Lymphatic: No axillary or cervical lymphadenopathy  MUSCULOSKELETAL:  Examination of the left shoulder demonstrates positive posterior load shift and positive O'Brien's.  Cuff strength is intact.  No anterior apprehension signs.  Imaging: MRI demonstrates a posterior labral tear with a periosteal sleeve avulsion-type appearance and paralabral cyst that seems to be forming.  Assessment: Left posterior labral tear.  Plan: Plan for Procedure(s): LEFT SHOULDER ARTHROSCOPY WITH POSTERIOR BANKART REPAIR  The risks benefits and alternatives were discussed with the patient including but not limited to the risks of nonoperative treatment, versus surgical intervention including infection, bleeding, nerve injury,  blood clots, cardiopulmonary complications, morbidity, mortality, among others, and they were willing to proceed.   The patient acknowledged the explanation, agreed to proceed with the plan and consent was signed.   Operative Plan: Left shoulder scope with posterior  capsulorrhaphy and labral repair  Discharge Medications: Standard DVT Prophylaxis: None Physical Therapy: Outpatient PT Special Discharge needs: Sling   Vernetta Honey, PA-C  10/12/2020 5:36 PM

## 2020-10-13 ENCOUNTER — Ambulatory Visit (HOSPITAL_BASED_OUTPATIENT_CLINIC_OR_DEPARTMENT_OTHER)
Admission: RE | Admit: 2020-10-13 | Discharge: 2020-10-13 | Disposition: A | Discharge status: Home / Self Care | Payer: BC Managed Care – PPO | Attending: Orthopaedic Surgery | Admitting: Orthopaedic Surgery

## 2020-10-13 ENCOUNTER — Ambulatory Visit (HOSPITAL_BASED_OUTPATIENT_CLINIC_OR_DEPARTMENT_OTHER): Payer: BC Managed Care – PPO | Admitting: Anesthesiology

## 2020-10-13 ENCOUNTER — Encounter (HOSPITAL_BASED_OUTPATIENT_CLINIC_OR_DEPARTMENT_OTHER): Payer: Self-pay | Admitting: Orthopaedic Surgery

## 2020-10-13 ENCOUNTER — Encounter (HOSPITAL_BASED_OUTPATIENT_CLINIC_OR_DEPARTMENT_OTHER)
Admission: RE | Disposition: A | Discharge status: Home / Self Care | Payer: Self-pay | Source: Home / Self Care | Attending: Orthopaedic Surgery

## 2020-10-13 ENCOUNTER — Other Ambulatory Visit: Payer: Self-pay

## 2020-10-13 DIAGNOSIS — M24112 Other articular cartilage disorders, left shoulder: Secondary | ICD-10-CM | POA: Diagnosis not present

## 2020-10-13 DIAGNOSIS — M5416 Radiculopathy, lumbar region: Secondary | ICD-10-CM | POA: Diagnosis not present

## 2020-10-13 DIAGNOSIS — S43492A Other sprain of left shoulder joint, initial encounter: Secondary | ICD-10-CM | POA: Diagnosis not present

## 2020-10-13 DIAGNOSIS — G8918 Other acute postprocedural pain: Secondary | ICD-10-CM | POA: Diagnosis not present

## 2020-10-13 DIAGNOSIS — F418 Other specified anxiety disorders: Secondary | ICD-10-CM | POA: Diagnosis not present

## 2020-10-13 DIAGNOSIS — Y9371 Activity, boxing: Secondary | ICD-10-CM | POA: Diagnosis not present

## 2020-10-13 DIAGNOSIS — S43005A Unspecified dislocation of left shoulder joint, initial encounter: Secondary | ICD-10-CM | POA: Insufficient documentation

## 2020-10-13 DIAGNOSIS — S43432A Superior glenoid labrum lesion of left shoulder, initial encounter: Secondary | ICD-10-CM | POA: Diagnosis not present

## 2020-10-13 HISTORY — PX: SHOULDER ARTHROSCOPY WITH BANKART REPAIR: SHX5673

## 2020-10-13 SURGERY — SHOULDER ARTHROSCOPY WITH BANKART REPAIR
Anesthesia: General | Site: Shoulder | Laterality: Left

## 2020-10-13 MED ORDER — MIDAZOLAM HCL 2 MG/2ML IJ SOLN
INTRAMUSCULAR | Status: DC | PRN
  Administered 2020-10-13: 2 mg via INTRAVENOUS

## 2020-10-13 MED ORDER — HYDROCODONE-ACETAMINOPHEN 7.5-325 MG PO TABS: 1.0000 | ORAL_TABLET | Freq: Once | ORAL | Status: DC | PRN

## 2020-10-13 MED ORDER — OXYCODONE HCL 5 MG PO TABS: ORAL_TABLET | ORAL | 0 refills | Status: AC

## 2020-10-13 MED ORDER — ONDANSETRON HCL 4 MG/2ML IJ SOLN
INTRAMUSCULAR | Status: DC | PRN
  Administered 2020-10-13: 4 mg via INTRAVENOUS

## 2020-10-13 MED ORDER — BUPIVACAINE-EPINEPHRINE (PF) 0.5% -1:200000 IJ SOLN
INTRAMUSCULAR | Status: DC | PRN
  Administered 2020-10-13: 20 mL via PERINEURAL

## 2020-10-13 MED ORDER — EPHEDRINE SULFATE 50 MG/ML IJ SOLN
INTRAMUSCULAR | Status: DC | PRN
  Administered 2020-10-13: 10 mg via INTRAVENOUS

## 2020-10-13 MED ORDER — PROPOFOL 10 MG/ML IV BOLUS
INTRAVENOUS | Status: AC
  Filled 2020-10-13: qty 20

## 2020-10-13 MED ORDER — FENTANYL CITRATE (PF) 100 MCG/2ML IJ SOLN
INTRAMUSCULAR | Status: AC
  Filled 2020-10-13: qty 2

## 2020-10-13 MED ORDER — LIDOCAINE HCL (CARDIAC) PF 100 MG/5ML IV SOSY
PREFILLED_SYRINGE | INTRAVENOUS | Status: DC | PRN
  Administered 2020-10-13: 100 mg via INTRAVENOUS

## 2020-10-13 MED ORDER — LACTATED RINGERS IV SOLN: INTRAVENOUS | Status: DC

## 2020-10-13 MED ORDER — MIDAZOLAM HCL 2 MG/2ML IJ SOLN
2.0000 mg | Freq: Once | INTRAMUSCULAR | Status: AC
  Administered 2020-10-13: 2 mg via INTRAVENOUS

## 2020-10-13 MED ORDER — ONDANSETRON HCL 4 MG/2ML IJ SOLN: 4.0000 mg | Freq: Once | INTRAMUSCULAR | Status: DC | PRN

## 2020-10-13 MED ORDER — FENTANYL CITRATE (PF) 100 MCG/2ML IJ SOLN
25.0000 ug | INTRAMUSCULAR | Status: DC | PRN
Start: 2020-10-13 — End: 2020-10-13

## 2020-10-13 MED ORDER — ONDANSETRON HCL 4 MG/2ML IJ SOLN
INTRAMUSCULAR | Status: AC
  Filled 2020-10-13: qty 2

## 2020-10-13 MED ORDER — CEFAZOLIN SODIUM-DEXTROSE 2-4 GM/100ML-% IV SOLN
2.0000 g | INTRAVENOUS | Status: AC
  Administered 2020-10-13: 2 g via INTRAVENOUS

## 2020-10-13 MED ORDER — FENTANYL CITRATE (PF) 100 MCG/2ML IJ SOLN
100.0000 ug | Freq: Once | INTRAMUSCULAR | Status: AC
  Administered 2020-10-13: 100 ug via INTRAVENOUS

## 2020-10-13 MED ORDER — ROCURONIUM BROMIDE 100 MG/10ML IV SOLN
INTRAVENOUS | Status: DC | PRN
  Administered 2020-10-13: 60 mg via INTRAVENOUS

## 2020-10-13 MED ORDER — ONDANSETRON HCL 4 MG PO TABS: 4.0000 mg | ORAL_TABLET | Freq: Three times a day (TID) | ORAL | 1 refills | Status: AC | PRN

## 2020-10-13 MED ORDER — CEFAZOLIN SODIUM-DEXTROSE 2-4 GM/100ML-% IV SOLN
INTRAVENOUS | Status: AC
  Filled 2020-10-13: qty 100

## 2020-10-13 MED ORDER — FENTANYL CITRATE (PF) 100 MCG/2ML IJ SOLN
INTRAMUSCULAR | Status: DC | PRN
  Administered 2020-10-13: 100 ug via INTRAVENOUS

## 2020-10-13 MED ORDER — MIDAZOLAM HCL 2 MG/2ML IJ SOLN
INTRAMUSCULAR | Status: AC
  Filled 2020-10-13: qty 2

## 2020-10-13 MED ORDER — SUGAMMADEX SODIUM 200 MG/2ML IV SOLN
INTRAVENOUS | Status: DC | PRN
  Administered 2020-10-13: 175 mg via INTRAVENOUS

## 2020-10-13 MED ORDER — ACETAMINOPHEN 500 MG PO TABS: 1000.0000 mg | ORAL_TABLET | Freq: Three times a day (TID) | ORAL | 0 refills | Status: AC

## 2020-10-13 MED ORDER — GLYCOPYRROLATE 0.2 MG/ML IJ SOLN
INTRAMUSCULAR | Status: DC | PRN
  Administered 2020-10-13: .2 mg via INTRAVENOUS

## 2020-10-13 MED ORDER — ROCURONIUM BROMIDE 10 MG/ML (PF) SYRINGE
PREFILLED_SYRINGE | INTRAVENOUS | Status: AC
  Filled 2020-10-13: qty 10

## 2020-10-13 MED ORDER — GLYCOPYRROLATE 0.2 MG/ML IJ SOLN
INTRAMUSCULAR | Status: AC
  Filled 2020-10-13: qty 1

## 2020-10-13 MED ORDER — PROPOFOL 10 MG/ML IV BOLUS
INTRAVENOUS | Status: DC | PRN
  Administered 2020-10-13: 200 mg via INTRAVENOUS

## 2020-10-13 MED ORDER — BUPIVACAINE LIPOSOME 1.3 % IJ SUSP
INTRAMUSCULAR | Status: DC | PRN
  Administered 2020-10-13: 10 mL via PERINEURAL

## 2020-10-13 MED ORDER — SODIUM CHLORIDE 0.9 % IR SOLN
Status: DC | PRN
  Administered 2020-10-13: 5000 mL

## 2020-10-13 MED ORDER — DEXAMETHASONE SODIUM PHOSPHATE 10 MG/ML IJ SOLN
INTRAMUSCULAR | Status: AC
  Filled 2020-10-13: qty 1

## 2020-10-13 MED ORDER — LIDOCAINE 2% (20 MG/ML) 5 ML SYRINGE
INTRAMUSCULAR | Status: AC
  Filled 2020-10-13: qty 5

## 2020-10-13 MED ORDER — MELOXICAM 7.5 MG PO TABS: 7.5000 mg | ORAL_TABLET | Freq: Every day | ORAL | 0 refills | Status: AC

## 2020-10-13 SURGICAL SUPPLY — 65 items
ANCHOR SUT 1.8 FBRTK KNTLS 2SU (Anchor) ×6 IMPLANT
APL PRP STRL LF DISP 70% ISPRP (MISCELLANEOUS) ×1
BLADE EXCALIBUR 4.0X13 (MISCELLANEOUS) ×2 IMPLANT
BNDG COHESIVE 4X5 TAN STRL (GAUZE/BANDAGES/DRESSINGS) IMPLANT
BUR SURG 4D 13L RD FLUTE (BUR) ×1 IMPLANT
BURR SURG 4D 13L RD FLUTE (BUR) ×2
CANNULA 5.75X71 LONG (CANNULA) ×2 IMPLANT
CANNULA TWIST IN 8.25X7CM (CANNULA) ×4 IMPLANT
CHLORAPREP W/TINT 26 (MISCELLANEOUS) ×2 IMPLANT
CLSR STERI-STRIP ANTIMIC 1/2X4 (GAUZE/BANDAGES/DRESSINGS) ×2 IMPLANT
COOLER ICEMAN CLASSIC (MISCELLANEOUS) ×2 IMPLANT
COVER WAND RF STERILE (DRAPES) IMPLANT
DECANTER SPIKE VIAL GLASS SM (MISCELLANEOUS) IMPLANT
DISSECTOR 3.5MM X 13CM CVD (MISCELLANEOUS) IMPLANT
DISSECTOR 4.0MMX13CM CVD (MISCELLANEOUS) IMPLANT
DRAPE IMP U-DRAPE 54X76 (DRAPES) ×2 IMPLANT
DRAPE INCISE IOBAN 66X45 STRL (DRAPES) IMPLANT
DRAPE SHOULDER BEACH CHAIR (DRAPES) ×2 IMPLANT
DRSG PAD ABDOMINAL 8X10 ST (GAUZE/BANDAGES/DRESSINGS) ×2 IMPLANT
DW OUTFLOW CASSETTE/TUBE SET (MISCELLANEOUS) ×2 IMPLANT
GAUZE SPONGE 4X4 12PLY STRL (GAUZE/BANDAGES/DRESSINGS) ×2 IMPLANT
GLOVE BIO SURGEON STRL SZ 6.5 (GLOVE) ×2 IMPLANT
GLOVE BIOGEL PI IND STRL 7.0 (GLOVE) ×2 IMPLANT
GLOVE BIOGEL PI IND STRL 7.5 (GLOVE) ×1 IMPLANT
GLOVE BIOGEL PI INDICATOR 7.0 (GLOVE) ×2
GLOVE BIOGEL PI INDICATOR 7.5 (GLOVE) ×1
GLOVE ECLIPSE 6.5 STRL STRAW (GLOVE) ×2 IMPLANT
GLOVE ECLIPSE 8.0 STRL XLNG CF (GLOVE) ×2 IMPLANT
GLOVE SRG 8 PF TXTR STRL LF DI (GLOVE) ×1 IMPLANT
GLOVE SURG SYN 7.5  E (GLOVE) ×1
GLOVE SURG SYN 7.5 E (GLOVE) ×1 IMPLANT
GLOVE SURG UNDER POLY LF SZ6.5 (GLOVE) ×2 IMPLANT
GLOVE SURG UNDER POLY LF SZ8 (GLOVE) ×2
GOWN STRL REUS W/ TWL LRG LVL3 (GOWN DISPOSABLE) ×2 IMPLANT
GOWN STRL REUS W/TWL LRG LVL3 (GOWN DISPOSABLE) ×4
GOWN STRL REUS W/TWL XL LVL3 (GOWN DISPOSABLE) ×4 IMPLANT
IV NS IRRIG 3000ML ARTHROMATIC (IV SOLUTION) ×4 IMPLANT
KIT PERC INSERT 3.0 KNTLS (KITS) ×2 IMPLANT
KIT STR SPEAR 1.8 FBRTK DISP (KITS) ×2 IMPLANT
LASSO 90 CVE QUICKPAS (DISPOSABLE) ×2 IMPLANT
LASSO CRESCENT QUICKPASS (SUTURE) ×2 IMPLANT
MANIFOLD NEPTUNE II (INSTRUMENTS) ×2 IMPLANT
NDL SAFETY ECLIPSE 18X1.5 (NEEDLE) IMPLANT
NEEDLE HYPO 18GX1.5 SHARP (NEEDLE)
PACK ARTHROSCOPY DSU (CUSTOM PROCEDURE TRAY) ×2 IMPLANT
PACK BASIN DAY SURGERY FS (CUSTOM PROCEDURE TRAY) ×2 IMPLANT
PAD COLD SHLDR WRAP-ON (PAD) ×2 IMPLANT
PAD ORTHO SHOULDER 7X19 LRG (SOFTGOODS) IMPLANT
PORT APPOLLO RF 90DEGREE MULTI (SURGICAL WAND) IMPLANT
SHEET MEDIUM DRAPE 40X70 STRL (DRAPES) ×2 IMPLANT
SLEEVE ARM SUSPENSION SYSTEM (MISCELLANEOUS) ×2 IMPLANT
SLEEVE SCD COMPRESS KNEE MED (MISCELLANEOUS) ×2 IMPLANT
SLING ARM FOAM STRAP LRG (SOFTGOODS) IMPLANT
SLING S3 LATERAL DISP (MISCELLANEOUS) ×2 IMPLANT
SUT FIBERWIRE #2 38 T-5 BLUE (SUTURE)
SUT MNCRL AB 4-0 PS2 18 (SUTURE) ×2 IMPLANT
SUT TIGER TAPE 7 IN WHITE (SUTURE) IMPLANT
SUTURE FIBERWR #2 38 T-5 BLUE (SUTURE) IMPLANT
SUTURE TAPE TIGERLINK 1.3MM BL (SUTURE) IMPLANT
SUTURETAPE TIGERLINK 1.3MM BL (SUTURE)
SYR 5ML LL (SYRINGE) IMPLANT
TAPE FIBER 2MM 7IN #2 BLUE (SUTURE) IMPLANT
TOWEL GREEN STERILE FF (TOWEL DISPOSABLE) ×2 IMPLANT
TUBE CONNECTING 20X1/4 (TUBING) ×4 IMPLANT
TUBING ARTHROSCOPY IRRIG 16FT (MISCELLANEOUS) ×2 IMPLANT

## 2020-10-13 NOTE — Op Note (Signed)
Orthopaedic Surgery Operative Note (CSN: [id number])  Jonathan Moreno  2001-06-06 Date of Surgery: 10/13/2020   Diagnoses:  LEFT SHOULDER DISLOCATION   Procedure: Arthroscopic extensive debridement Arthroscopic labral repair and capsulorrhaphy   Operative Finding Exam under anesthesia: Patient had a posterior click to posterior directed force.  No anterior instability.   Articular space: Posterior labral tear from 3-5:30 Chondral surfaces:Intact, no sign of chondral degeneration on the glenoid or humeral head, non engaging reverse hill sachs noted.  I attempted to get it to engage but it was not engaging. Biceps: Normal Subscapularis: Normal Superior Cuff:normal Bursal side: normal  Successful completion of the planned procedure.  Patient had a posterior complete small labral tear.  Tissue quality posteriorly was moderate at best.  We were able to do a mattress and two simple stitches to reapproximate the tissue.   Post-operative plan: The patient will be non-weightbearing in a sling for 6 weeks with PT to start after.  The patient will be discharged home.  DVT prophylaxis not indicated in ambulatory upper extremity patient without known risk factors.   Pain control with PRN pain medication preferring oral medicines.  Follow up plan will be scheduled in approximately 7 days for incision check.  Therapy to start week 3-4 with a posterior bankart protocol  Post-Op Diagnosis: Same Surgeons:Primary: Hiram Gash, MD Assistants:Caroline McBane PA-C Location: [place] OR ROOM 6 Anesthesia: General with Exparel interscalene block Antibiotics: Ancef 2 g Tourniquet time: None Estimated Blood Loss: Minimal Complications: None Specimens: None Implants: Implant Name Type Inv. Item Serial No. Manufacturer Lot No. LRB No. Used Action  ANCHOR SUT 1.8 FIBERTAK 2 SUT - [id number] Anchor ANCHOR SUT 1.8 FIBERTAK 2 SUT  [employer] [id number] Left 1 Implanted  ANCHOR SUT 1.8 FIBERTAK 2 SUT - [id number]  Anchor ANCHOR SUT 1.8 FIBERTAK 2 SUT  [employer] [id number] Left 1 Implanted  ANCHOR SUT 1.8 FIBERTAK 2 SUT - [id number] Anchor ANCHOR SUT 1.8 FIBERTAK 2 SUT  [employer] [id number] Left 1 Implanted    Indications for Surgery:   Jonathan Moreno is a 18 y.o. male with shoulder instability failing non-operative management and at risk of continued instability.  We discussed options including continued rehab versus surgery.  Family and patient understand the nature of postop recovery.  The risks and benefits were explained at length including but not limited to continued pain, cuff failure, continued instability, pain, hardware malfunction, infection and stiffness were all discussed.   Procedure:   Patient was correctly identified in the preoperative holding area and operative site marked.  Patient brought to OR and positioned lateral on a beanbag with an arthrex lateral positioner.  Anesthesia was induced and the operative shoulder was prepped and draped in the usual sterile fashion.  Timeout was called preincision.  We began by making our portals including an anterior inferior portal just above the subscap by placing a 6 spinal needle then switching stick and placing a cannula.  We made an anterior accessory portal just above this just below the biceps.  Once these were performed formed we switched the camera to the anterior portal and were able to place a posterior superior and posterior inferior portal.  The posterior inferior portal was made with a percutaneous kit made by [employer].  Once portals were made we began by assessing our tissue.  Findings are above.  We mobilized the labral tear extensively.    The posterior tear was mobilized were able to achieve good apposition and lateralization of the tissue.  This  point we began by placing anchors.  We started at the 630 o'clock position and placed a knotless 1.8 mm Fibertak anchor shuttling sutures in typical fashion obtaining good purchase of the  capsule labral tissue.  We repeated this process moving anteriorly placing 3 anchors between 630 and 330  Good purchase of the tissue was obtained the tissue quality was moderate. The posterior most anchor was a mattress stitch. Tissue quality above this was poor and we did not feel that we need to tighten between the 1:00 and 3 o'clock position as the patient's labrum was intact there. The head was centered at the finish of the case.   The incisions were closed with absorbable monocryl and steri strips.  A sterile dressing was placed along with a sling. The patient was awoken from general anesthesia and taken to the PACU in stable condition without complication.   Noemi Chapel, PA-C, present and scrubbed throughout the case, critical for completion in a timely fashion, and for retraction, instrumentation, closure.

## 2020-10-13 NOTE — Interval H&P Note (Signed)
History and Physical Interval Note:  10/13/2020 8:17 AM  Jonathan Moreno  has presented today for surgery, with the diagnosis of LEFT SHOULDER DISLOCATION S43.006A.  The various methods of treatment have been discussed with the patient and family. After consideration of risks, benefits and other options for treatment, the patient has consented to  Procedure(s): LEFT SHOULDER ARTHROSCOPY WITH POSTERIOR BANKART REPAIR (Left) as a surgical intervention.  The patient's history has been reviewed, patient examined, no change in status, stable for surgery.  I have reviewed the patient's chart and labs.  Questions were answered to the patient's satisfaction.     Bjorn Pippin

## 2020-10-13 NOTE — Transfer of Care (Signed)
Immediate Anesthesia Transfer of Care Note  Patient: Jonathan Moreno  Procedure(s) Performed: LEFT SHOULDER ARTHROSCOPY WITH POSTERIOR BANKART REPAIR (Left Shoulder)  Patient Location: PACU  Anesthesia Type:General and Regional  Level of Consciousness: awake, alert  and patient cooperative  Airway & Oxygen Therapy: Patient Spontanous Breathing and Patient connected to face mask oxygen  Post-op Assessment: Report given to RN, Post -op Vital signs reviewed and stable, Patient moving all extremities X 4 and Patient able to stick tongue midline  Post vital signs: Reviewed and stable  Last Vitals:  Vitals Value Taken Time  BP 152/72 10/13/20 1054  Temp    Pulse 113 10/13/20 1056  Resp 15 10/13/20 1056  SpO2 99 % 10/13/20 1056  Vitals shown include unvalidated device data.  Last Pain:  Vitals:   10/13/20 0806  TempSrc: Oral  PainSc: 0-No pain         Complications: No complications documented.

## 2020-10-13 NOTE — Anesthesia Preprocedure Evaluation (Signed)
Anesthesia Evaluation  Patient identified by MRN, date of birth, ID band Patient awake    Reviewed: Allergy & Precautions, NPO status , Patient's Chart, lab work & pertinent test results  Airway Mallampati: II  TM Distance: >3 FB Neck ROM: Full    Dental no notable dental hx. (+) Teeth Intact   Pulmonary neg pulmonary ROS,    Pulmonary exam normal breath sounds clear to auscultation       Cardiovascular negative cardio ROS Normal cardiovascular exam Rhythm:Regular Rate:Normal     Neuro/Psych PSYCHIATRIC DISORDERS Anxiety Depression  Neuromuscular disease    GI/Hepatic negative GI ROS, Neg liver ROS,   Endo/Other  negative endocrine ROS  Renal/GU negative Renal ROS  negative genitourinary   Musculoskeletal Labral tear left shoulder Left radicular lumbar back pain   Abdominal   Peds  Hematology negative hematology ROS (+)   Anesthesia Other Findings   Reproductive/Obstetrics                             Anesthesia Physical Anesthesia Plan  ASA: II  Anesthesia Plan: General   Post-op Pain Management:  Regional for Post-op pain   Induction: Intravenous  PONV Risk Score and Plan: 3 and Midazolam, Ondansetron and Treatment may vary due to age or medical condition  Airway Management Planned: Oral ETT  Additional Equipment:   Intra-op Plan:   Post-operative Plan: Extubation in OR  Informed Consent: I have reviewed the patients History and Physical, chart, labs and discussed the procedure including the risks, benefits and alternatives for the proposed anesthesia with the patient or authorized representative who has indicated his/her understanding and acceptance.     Dental advisory given  Plan Discussed with: CRNA and Anesthesiologist  Anesthesia Plan Comments:         Anesthesia Quick Evaluation

## 2020-10-13 NOTE — Discharge Instructions (Signed)
Regional Anesthesia Blocks ° °1. Numbness or the inability to move the "blocked" extremity may last from 3-48 hours after placement. The length of time depends on the medication injected and your individual response to the medication. If the numbness is not going away after 48 hours, call your surgeon. ° °2. The extremity that is blocked will need to be protected until the numbness is gone and the  Strength has returned. Because you cannot feel it, you will need to take extra care to avoid injury. Because it may be weak, you may have difficulty moving it or using it. You may not know what position it is in without looking at it while the block is in effect. ° °3. For blocks in the legs and feet, returning to weight bearing and walking needs to be done carefully. You will need to wait until the numbness is entirely gone and the strength has returned. You should be able to move your leg and foot normally before you try and bear weight or walk. You will need someone to be with you when you first try to ensure you do not fall and possibly risk injury. ° °4. Bruising and tenderness at the needle site are common side effects and will resolve in a few days. ° °5. Persistent numbness or new problems with movement should be communicated to the surgeon or the [facility] ([phone])/ [facility] ([phone]). ° ° °Information for Discharge Teaching: °EXPAREL (bupivacaine liposome injectable suspension)  ° °Your surgeon or anesthesiologist gave you EXPAREL(bupivacaine) to help control your pain after surgery.  °· EXPAREL is a local anesthetic that provides pain relief by numbing the tissue around the surgical site. °· EXPAREL is designed to release pain medication over time and can control pain for up to 72 hours. °· Depending on how you respond to EXPAREL, you may require less pain medication during your recovery. ° °Possible side effects: °· Temporary loss of sensation or ability to move in the  area where bupivacaine was injected. °· Nausea, vomiting, constipation °· Rarely, numbness and tingling in your mouth or lips, lightheadedness, or anxiety may occur. °· Call your doctor right away if you think you may be experiencing any of these sensations, or if you have other questions regarding possible side effects. ° °Follow all other discharge instructions given to you by your surgeon or nurse. Eat a healthy diet and drink plenty of water or other fluids. ° °If you return to the hospital for any reason within 96 hours following the administration of EXPAREL, it is important for health care providers to know that you have received this anesthetic. A teal colored band has been placed on your arm with the date, time and amount of EXPAREL you have received in order to alert and inform your health care providers. Please leave this armband in place for the full 96 hours following administration, and then you may remove the band. ° ° ° °Post Anesthesia Home Care Instructions ° °Activity: °Get plenty of rest for the remainder of the day. A responsible individual must stay with you for 24 hours following the procedure.  °For the next 24 hours, DO NOT: °-Drive a car °-Operate machinery °-Drink alcoholic beverages °-Take any medication unless instructed by your physician °-Make any legal decisions or sign important papers. ° °Meals: °Start with liquid foods such as gelatin or soup. Progress to regular foods as tolerated. Avoid greasy, spicy, heavy foods. If nausea and/or vomiting occur, drink only clear liquids   until the nausea and/or vomiting subsides. Call your physician if vomiting continues. ° °Special Instructions/Symptoms: °Your throat may feel dry or sore from the anesthesia or the breathing tube placed in your throat during surgery. If this causes discomfort, gargle with warm salt water. The discomfort should disappear within 24 hours. ° °If you had a scopolamine patch placed behind your ear for the management  of post- operative nausea and/or vomiting: ° °1. The medication in the patch is effective for 72 hours, after which it should be removed.  Wrap patch in a tissue and discard in the trash. Wash hands thoroughly with soap and water. °2. You may remove the patch earlier than 72 hours if you experience unpleasant side effects which may include dry mouth, dizziness or visual disturbances. °3. Avoid touching the patch. Wash your hands with soap and water after contact with the patch. °   ° °

## 2020-10-13 NOTE — Anesthesia Procedure Notes (Signed)
Anesthesia Regional Block: Interscalene brachial plexus block   Pre-Anesthetic Checklist: ,, timeout performed, Correct Patient, Correct Site, Correct Laterality, Correct Procedure, Correct Position, site marked, Risks and benefits discussed,  Surgical consent,  Pre-op evaluation,  At surgeon's request and post-op pain management  Laterality: Left  Prep: chloraprep       Needles:  Injection technique: Single-shot  Needle Type: Echogenic Stimulator Needle     Needle Length: 9cm  Needle Gauge: 21   Needle insertion depth: 5 cm   Additional Needles:   Procedures:,,,, ultrasound used (permanent image in chart),,,,  Narrative:  Start time: 10/13/2020 9:12 AM End time: 10/13/2020 9:17 AM Injection made incrementally with aspirations every 5 mL.  Performed by: Personally  Anesthesiologist: Mal Amabile, MD  Additional Notes: Timeout performed. Patient sedated. Relevant anatomy ID'd using [place]. Incremental 2-71ml injection of LA with frequent aspiration. Patient tolerated procedure well.        Left Interscalene Block

## 2020-10-13 NOTE — Progress Notes (Signed)
Assisted Dr. Foster with left, ultrasound guided, supraclavicular block. Side rails up, monitors on throughout procedure. See vital signs in flow sheet. Tolerated Procedure well. ?

## 2020-10-13 NOTE — Anesthesia Postprocedure Evaluation (Signed)
Anesthesia Post Note  Patient: Jonathan Moreno  Procedure(s) Performed: LEFT SHOULDER ARTHROSCOPY WITH POSTERIOR BANKART REPAIR (Left Shoulder)     Patient location during evaluation: PACU Anesthesia Type: General Level of consciousness: awake and alert and oriented Pain management: pain level controlled Vital Signs Assessment: post-procedure vital signs reviewed and stable Respiratory status: spontaneous breathing, nonlabored ventilation and respiratory function stable Cardiovascular status: blood pressure returned to baseline and stable Postop Assessment: no apparent nausea or vomiting Anesthetic complications: no   No complications documented.  Last Vitals:  Vitals:   10/13/20 1115 10/13/20 1127  BP: (!) 133/93   Pulse: 81 97  Resp: 13 (!) 21  Temp:    SpO2: 99% 98%    Last Pain:  Vitals:   10/13/20 1115  TempSrc:   PainSc: 0-No pain                 Kamilah Correia A.

## 2020-10-13 NOTE — Anesthesia Procedure Notes (Signed)
Procedure Name: Intubation Date/Time: 10/13/2020 9:45 AM Performed by: Collier Bullock, CRNA Pre-anesthesia Checklist: Patient identified, Emergency Drugs available, Suction available and Patient being monitored Patient Re-evaluated:Patient Re-evaluated prior to induction Oxygen Delivery Method: Circle system utilized Preoxygenation: Pre-oxygenation with 100% oxygen Induction Type: IV induction Ventilation: Mask ventilation without difficulty Laryngoscope Size: Mac and 4 Grade View: Grade I Tube type: Oral Tube size: 7.0 mm Number of attempts: 1 Airway Equipment and Method: Stylet Placement Confirmation: ETT inserted through vocal cords under direct vision,  positive ETCO2 and breath sounds checked- equal and bilateral Secured at: 23 cm Tube secured with: Tape Dental Injury: Teeth and Oropharynx as per pre-operative assessment

## 2020-10-14 ENCOUNTER — Encounter (HOSPITAL_BASED_OUTPATIENT_CLINIC_OR_DEPARTMENT_OTHER): Payer: Self-pay | Admitting: Orthopaedic Surgery

## 2020-10-14 NOTE — Addendum Note (Signed)
Addendum  created 10/14/20 1216 by Daniela Siebers, Jewel Baize, CRNA   Charge Capture section accepted

## 2020-10-19 DIAGNOSIS — M24112 Other articular cartilage disorders, left shoulder: Secondary | ICD-10-CM | POA: Diagnosis not present

## 2020-11-25 DIAGNOSIS — M24112 Other articular cartilage disorders, left shoulder: Secondary | ICD-10-CM | POA: Diagnosis not present

## 2020-12-05 IMAGING — DX DG KNEE 1-2V*L*
2 series · 2 of 2 positions shown · non-contrast
Comparison: None.

CLINICAL DATA: Injured right knee 1 and half weeks ago playing
football, now with anterior knee pain.

EXAM:
LEFT KNEE - 1-2 VIEW

[tunnel]
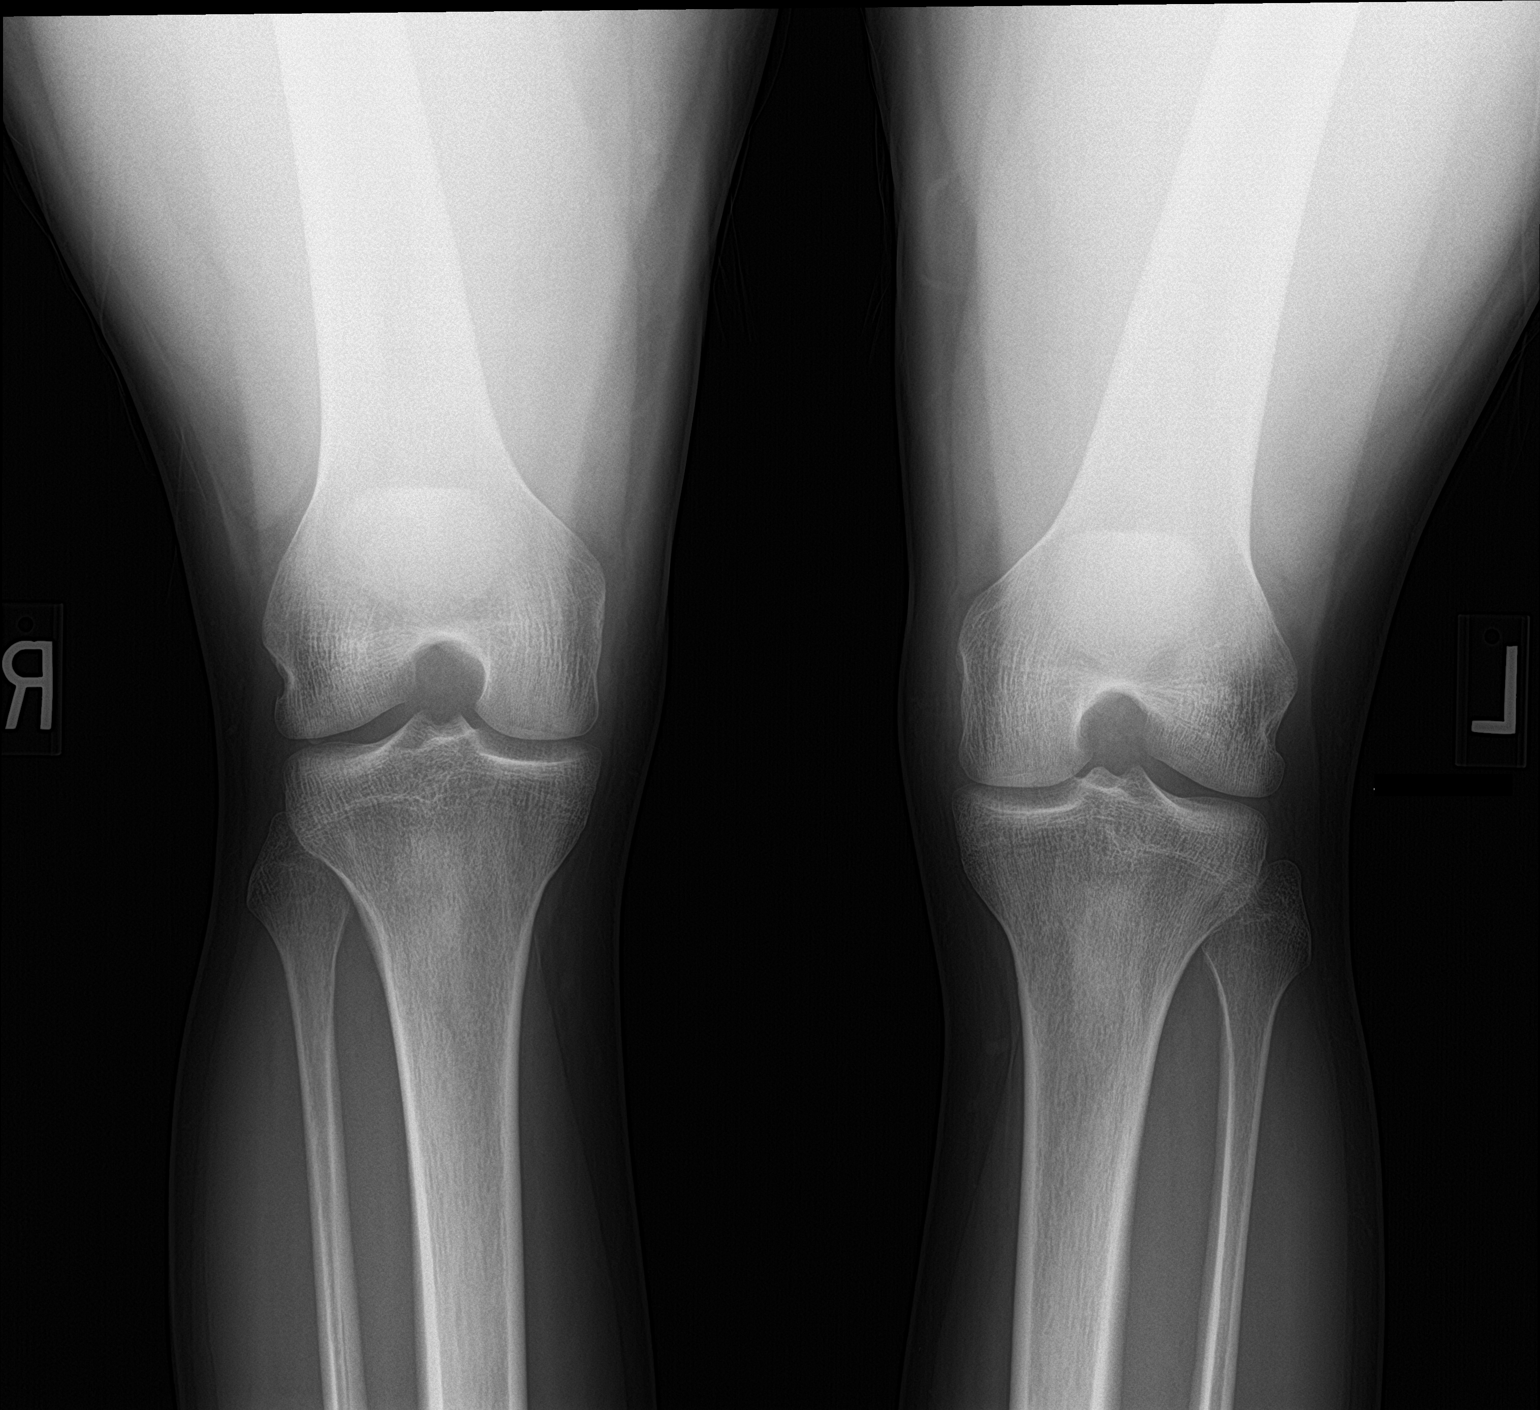

[knee ap bilat standing]
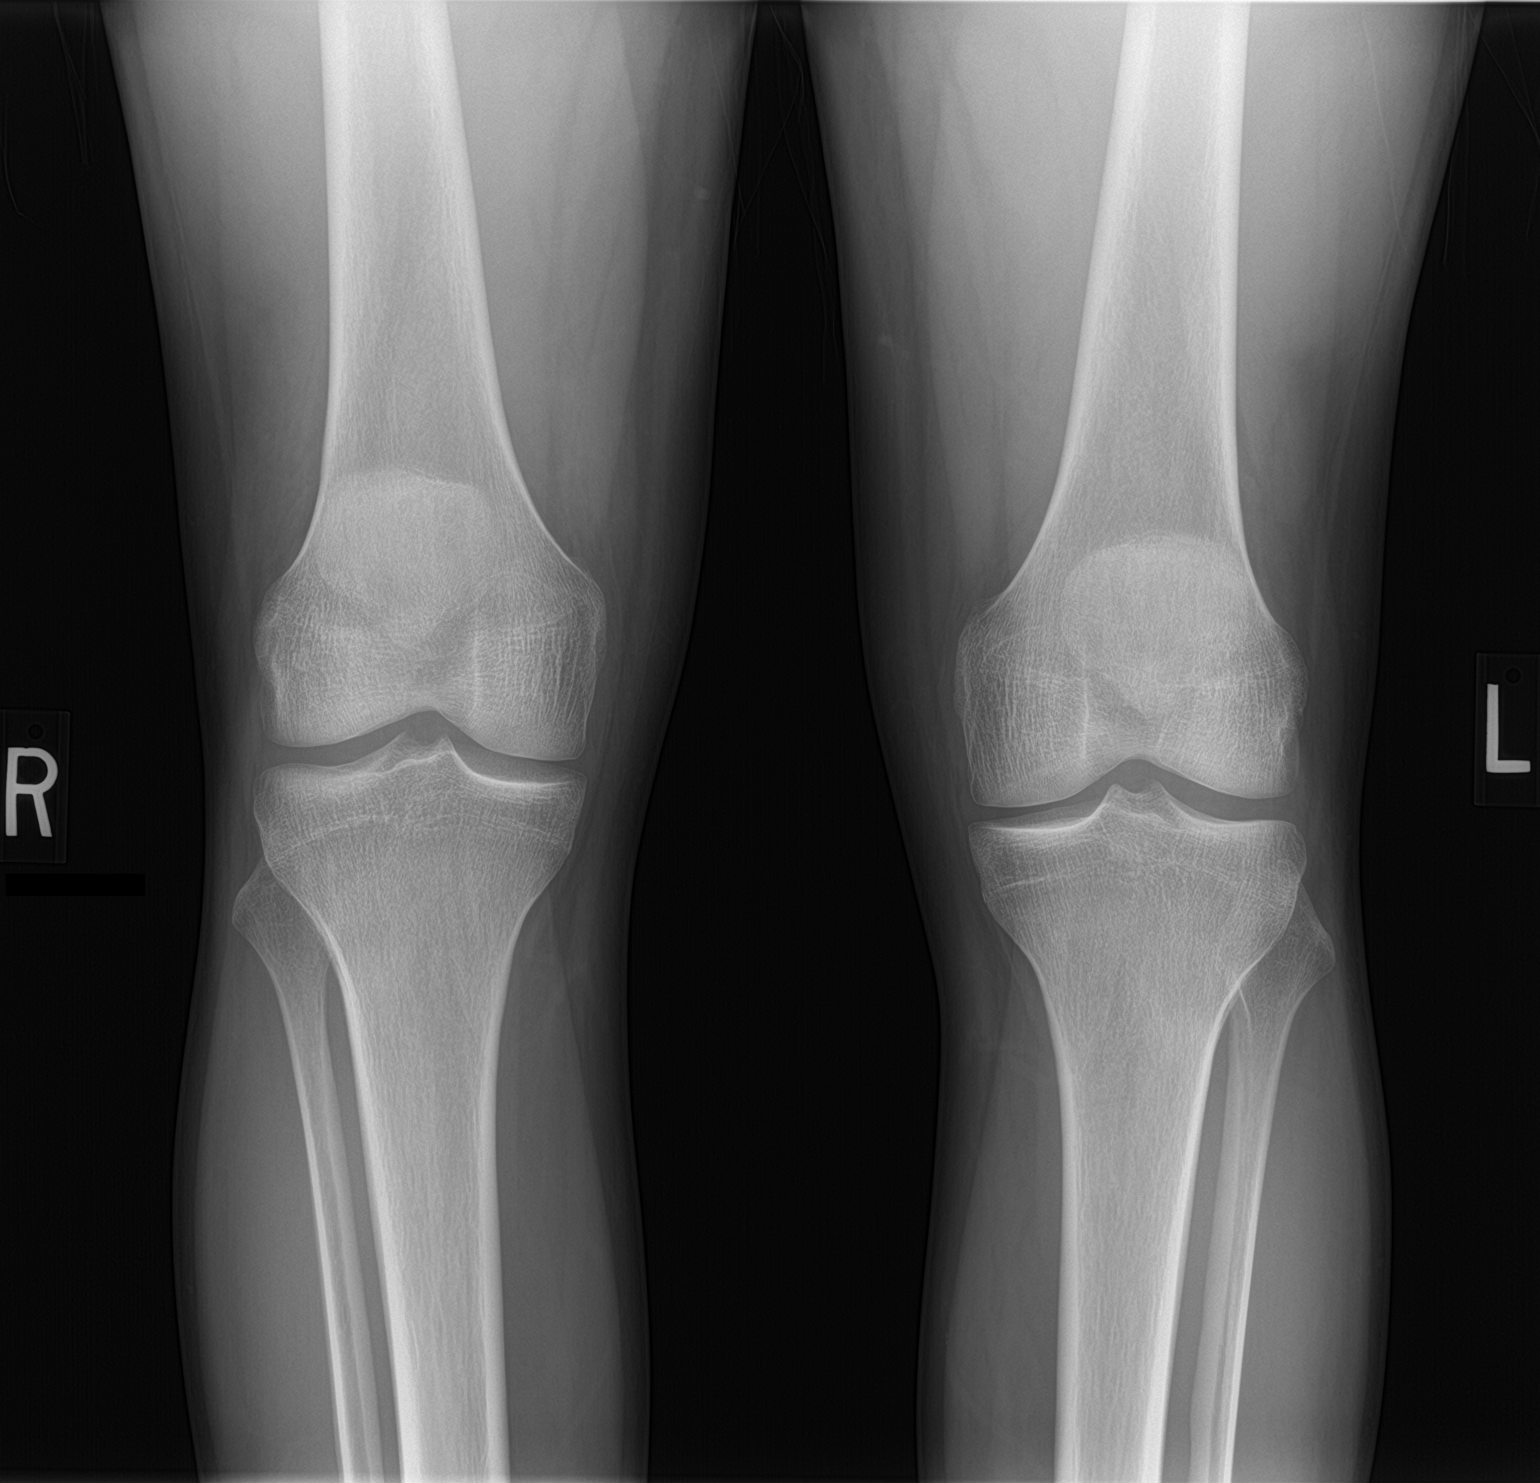

[2 of 2 positions shown; findings below may reference images not displayed]

FINDINGS: No fracture or dislocation given AP projection. Medial and lateral
joint spaces appear preserved. No evidence of chondrocalcinosis.
Regional soft tissues appear normal.
IMPRESSION: No definite explanation for patient's left knee pain.

## 2020-12-05 IMAGING — DX DG SHOULDER 2+V*L*
3 series · 3 of 3 positions shown · non-contrast
Comparison: None.

CLINICAL DATA: Left shoulder pain.  Concern for labral tear.

EXAM:
LEFT SHOULDER - 2+ VIEW

[shoulder grashey]
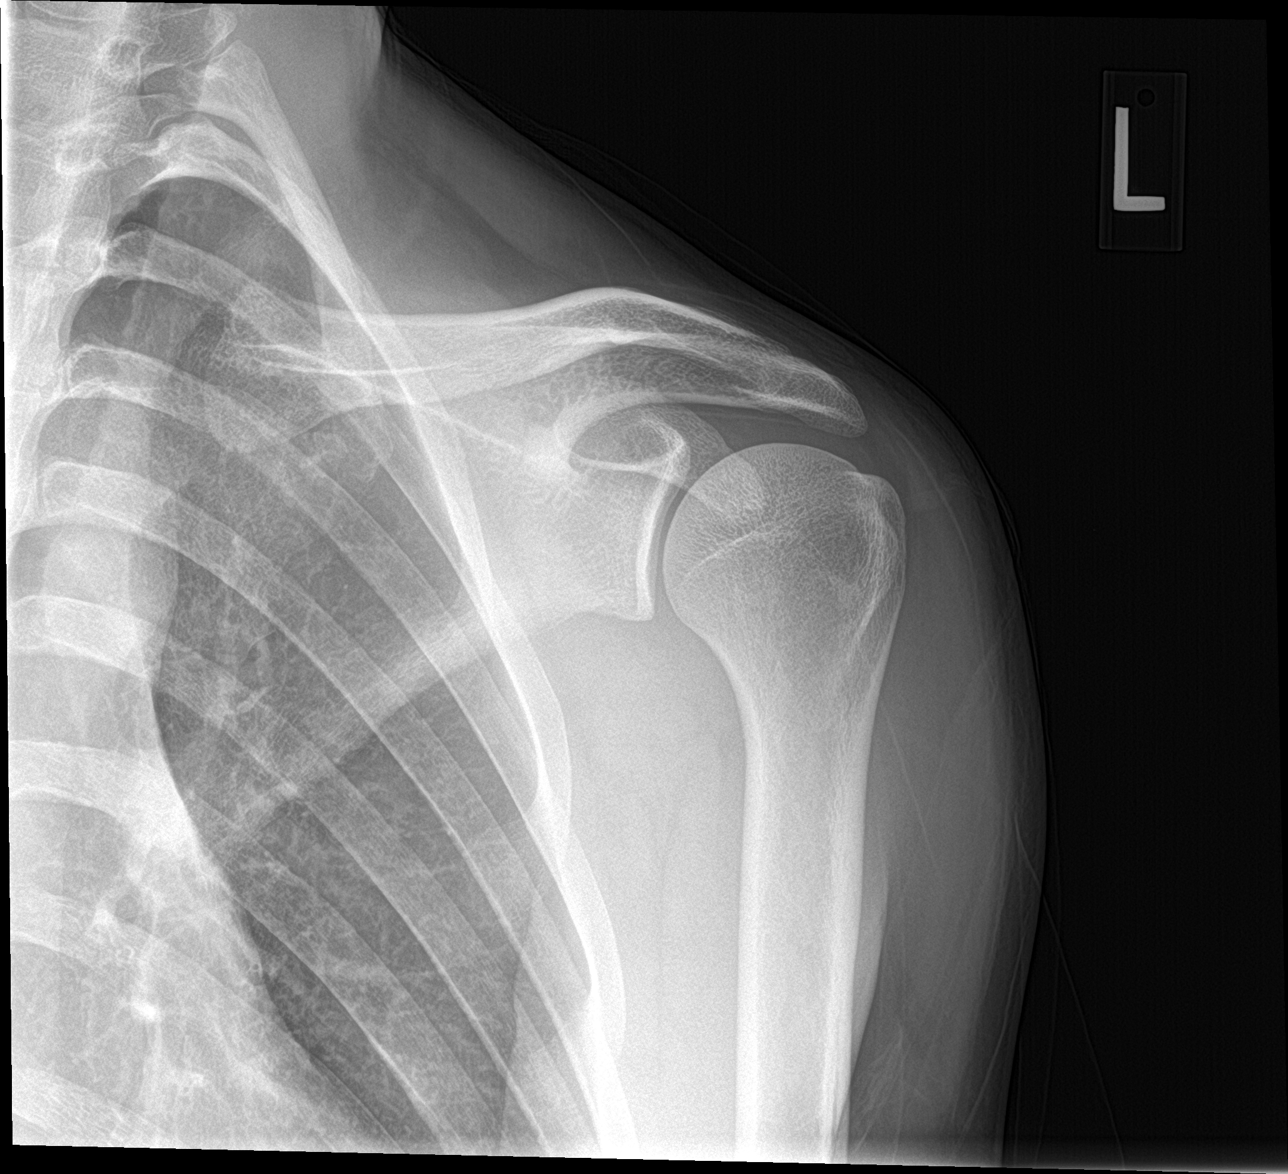

[shoulder y view]
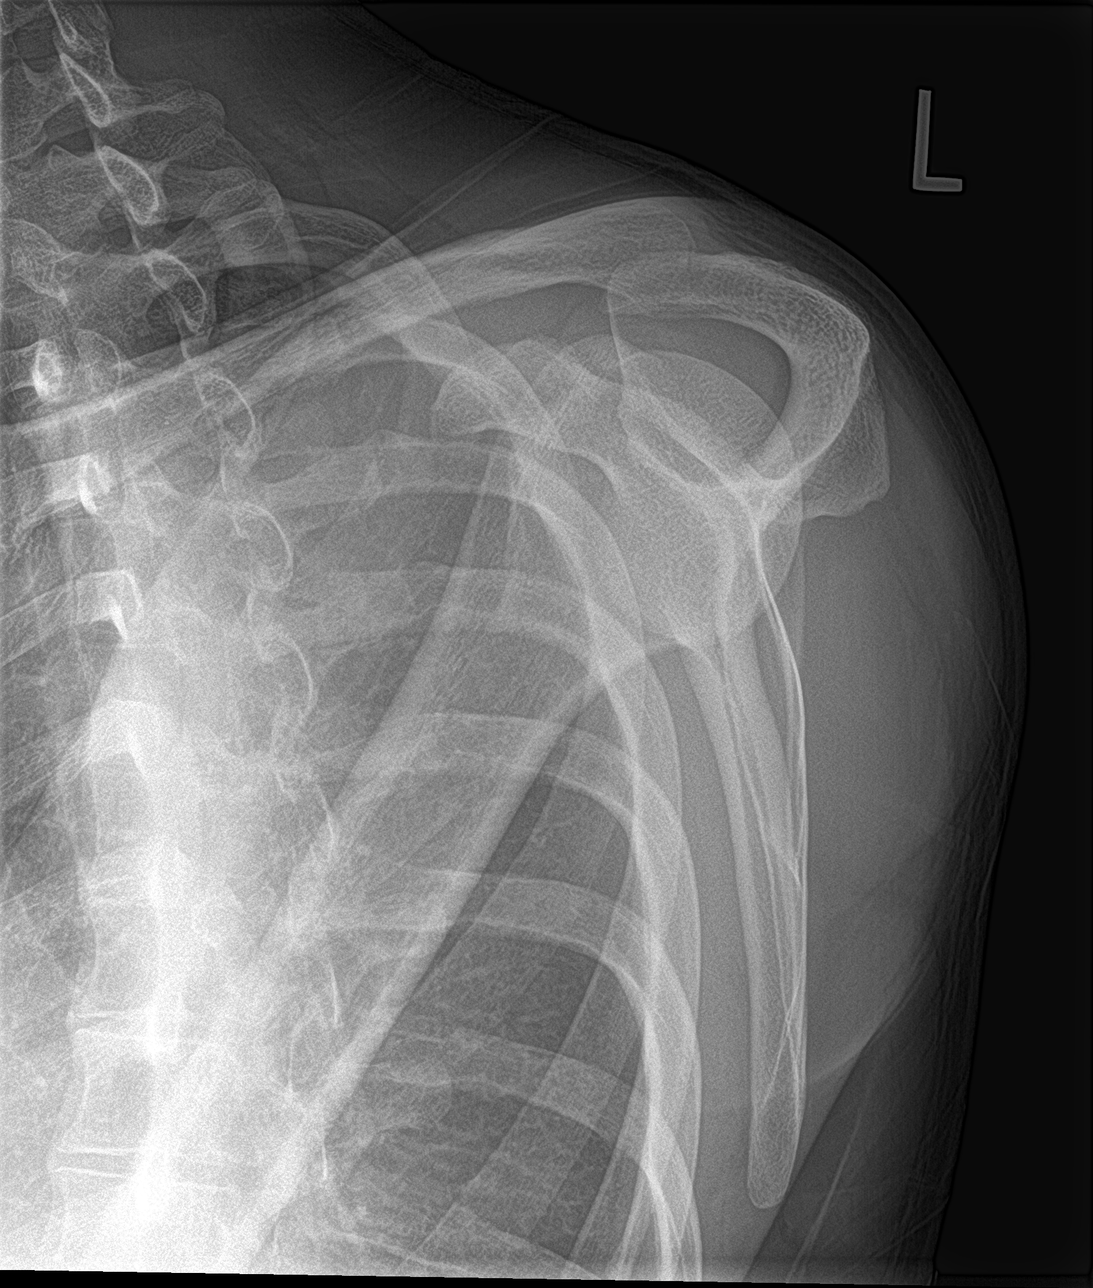

[shoulder axillary]
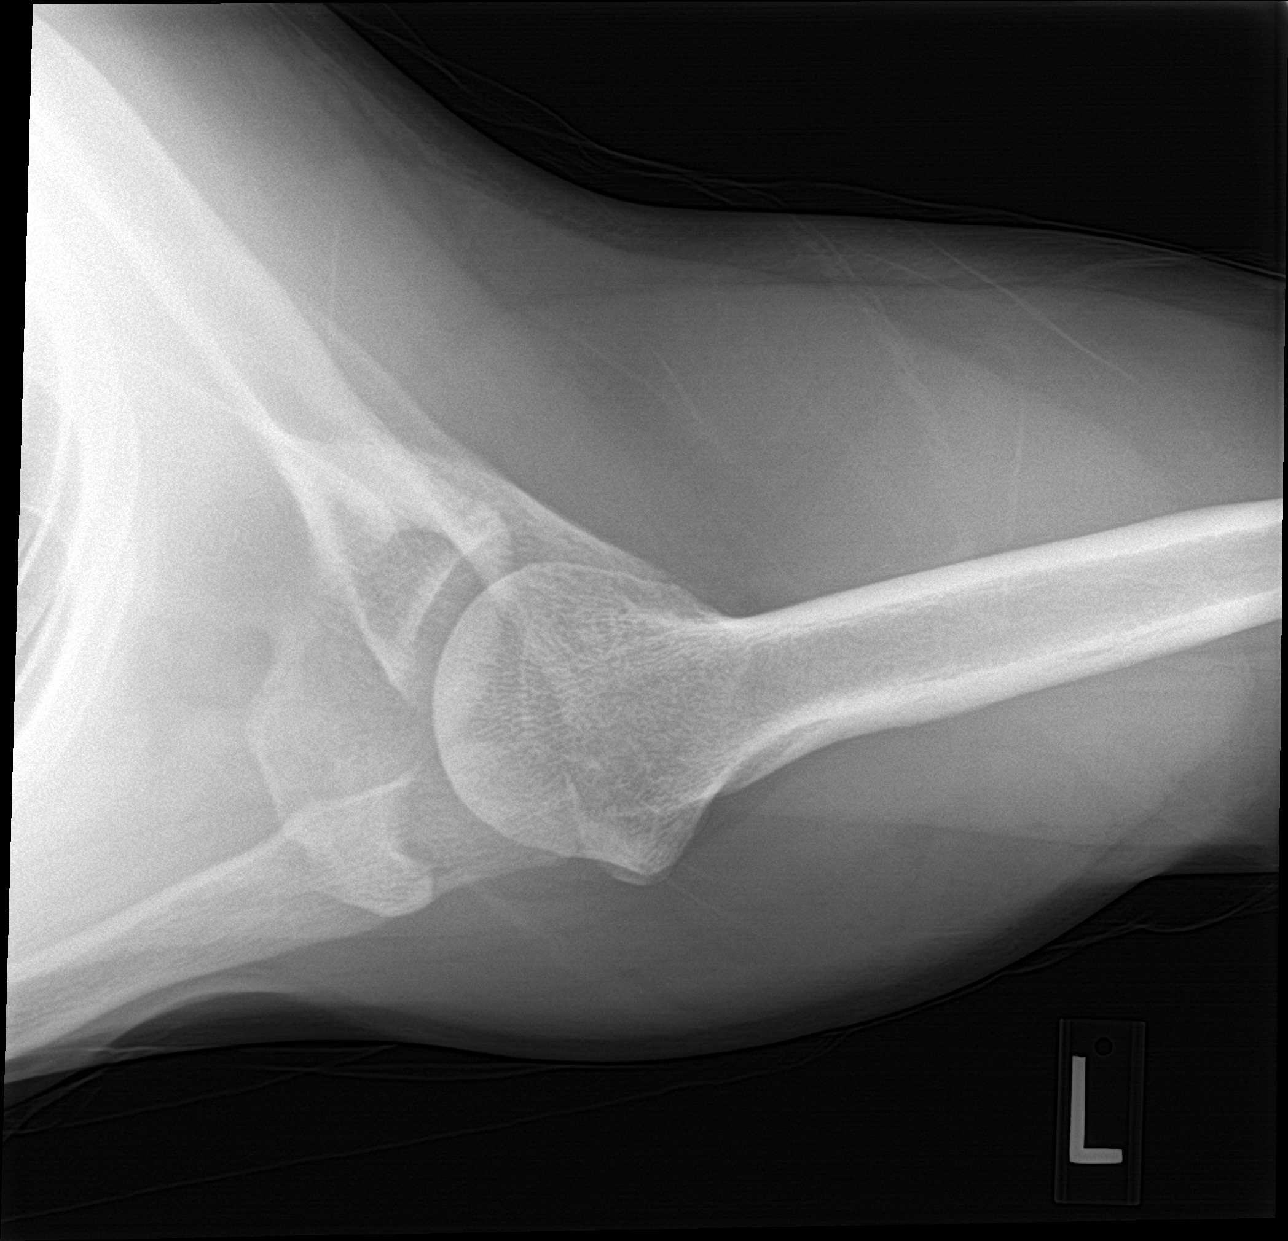

[3 of 3 positions shown; findings below may reference images not displayed]

FINDINGS: No fracture or dislocation. Glenohumeral and acromioclavicular joint
spaces appear preserved. No evidence of calcific tendinitis. Limited
visualization of the adjacent thorax is normal. Regional soft
tissues appear normal.
IMPRESSION: No explanation for patient's left shoulder pain.

## 2020-12-08 DIAGNOSIS — M62512 Muscle wasting and atrophy, not elsewhere classified, left shoulder: Secondary | ICD-10-CM | POA: Diagnosis not present

## 2020-12-08 DIAGNOSIS — Z4789 Encounter for other orthopedic aftercare: Secondary | ICD-10-CM | POA: Diagnosis not present

## 2020-12-08 DIAGNOSIS — M25612 Stiffness of left shoulder, not elsewhere classified: Secondary | ICD-10-CM | POA: Diagnosis not present

## 2020-12-08 DIAGNOSIS — M25512 Pain in left shoulder: Secondary | ICD-10-CM | POA: Diagnosis not present

## 2020-12-08 DIAGNOSIS — R293 Abnormal posture: Secondary | ICD-10-CM | POA: Diagnosis not present

## 2020-12-10 DIAGNOSIS — M25512 Pain in left shoulder: Secondary | ICD-10-CM | POA: Diagnosis not present

## 2020-12-10 DIAGNOSIS — Z4789 Encounter for other orthopedic aftercare: Secondary | ICD-10-CM | POA: Diagnosis not present

## 2020-12-10 DIAGNOSIS — R293 Abnormal posture: Secondary | ICD-10-CM | POA: Diagnosis not present

## 2020-12-10 DIAGNOSIS — M25612 Stiffness of left shoulder, not elsewhere classified: Secondary | ICD-10-CM | POA: Diagnosis not present

## 2020-12-10 DIAGNOSIS — M62512 Muscle wasting and atrophy, not elsewhere classified, left shoulder: Secondary | ICD-10-CM | POA: Diagnosis not present

## 2020-12-13 DIAGNOSIS — Z4789 Encounter for other orthopedic aftercare: Secondary | ICD-10-CM | POA: Diagnosis not present

## 2020-12-13 DIAGNOSIS — R293 Abnormal posture: Secondary | ICD-10-CM | POA: Diagnosis not present

## 2020-12-13 DIAGNOSIS — M25512 Pain in left shoulder: Secondary | ICD-10-CM | POA: Diagnosis not present

## 2020-12-13 DIAGNOSIS — M25612 Stiffness of left shoulder, not elsewhere classified: Secondary | ICD-10-CM | POA: Diagnosis not present

## 2020-12-13 DIAGNOSIS — M62512 Muscle wasting and atrophy, not elsewhere classified, left shoulder: Secondary | ICD-10-CM | POA: Diagnosis not present

## 2020-12-17 DIAGNOSIS — M25512 Pain in left shoulder: Secondary | ICD-10-CM | POA: Diagnosis not present

## 2020-12-17 DIAGNOSIS — M25612 Stiffness of left shoulder, not elsewhere classified: Secondary | ICD-10-CM | POA: Diagnosis not present

## 2020-12-17 DIAGNOSIS — R293 Abnormal posture: Secondary | ICD-10-CM | POA: Diagnosis not present

## 2020-12-17 DIAGNOSIS — M62512 Muscle wasting and atrophy, not elsewhere classified, left shoulder: Secondary | ICD-10-CM | POA: Diagnosis not present

## 2020-12-17 DIAGNOSIS — Z4789 Encounter for other orthopedic aftercare: Secondary | ICD-10-CM | POA: Diagnosis not present

## 2020-12-20 DIAGNOSIS — R293 Abnormal posture: Secondary | ICD-10-CM | POA: Diagnosis not present

## 2020-12-20 DIAGNOSIS — M25512 Pain in left shoulder: Secondary | ICD-10-CM | POA: Diagnosis not present

## 2020-12-20 DIAGNOSIS — M62512 Muscle wasting and atrophy, not elsewhere classified, left shoulder: Secondary | ICD-10-CM | POA: Diagnosis not present

## 2020-12-20 DIAGNOSIS — Z4789 Encounter for other orthopedic aftercare: Secondary | ICD-10-CM | POA: Diagnosis not present

## 2020-12-20 DIAGNOSIS — M25612 Stiffness of left shoulder, not elsewhere classified: Secondary | ICD-10-CM | POA: Diagnosis not present

## 2020-12-24 DIAGNOSIS — M25512 Pain in left shoulder: Secondary | ICD-10-CM | POA: Diagnosis not present

## 2020-12-24 DIAGNOSIS — R293 Abnormal posture: Secondary | ICD-10-CM | POA: Diagnosis not present

## 2020-12-24 DIAGNOSIS — M62512 Muscle wasting and atrophy, not elsewhere classified, left shoulder: Secondary | ICD-10-CM | POA: Diagnosis not present

## 2020-12-24 DIAGNOSIS — M25612 Stiffness of left shoulder, not elsewhere classified: Secondary | ICD-10-CM | POA: Diagnosis not present

## 2020-12-24 DIAGNOSIS — Z4789 Encounter for other orthopedic aftercare: Secondary | ICD-10-CM | POA: Diagnosis not present

## 2020-12-27 DIAGNOSIS — M25612 Stiffness of left shoulder, not elsewhere classified: Secondary | ICD-10-CM | POA: Diagnosis not present

## 2020-12-27 DIAGNOSIS — Z4789 Encounter for other orthopedic aftercare: Secondary | ICD-10-CM | POA: Diagnosis not present

## 2020-12-27 DIAGNOSIS — M62512 Muscle wasting and atrophy, not elsewhere classified, left shoulder: Secondary | ICD-10-CM | POA: Diagnosis not present

## 2020-12-27 DIAGNOSIS — M25512 Pain in left shoulder: Secondary | ICD-10-CM | POA: Diagnosis not present

## 2020-12-27 DIAGNOSIS — R293 Abnormal posture: Secondary | ICD-10-CM | POA: Diagnosis not present

## 2021-01-10 DIAGNOSIS — M62512 Muscle wasting and atrophy, not elsewhere classified, left shoulder: Secondary | ICD-10-CM | POA: Diagnosis not present

## 2021-01-10 DIAGNOSIS — M25612 Stiffness of left shoulder, not elsewhere classified: Secondary | ICD-10-CM | POA: Diagnosis not present

## 2021-01-10 DIAGNOSIS — M25512 Pain in left shoulder: Secondary | ICD-10-CM | POA: Diagnosis not present

## 2021-01-10 DIAGNOSIS — R293 Abnormal posture: Secondary | ICD-10-CM | POA: Diagnosis not present

## 2021-01-10 DIAGNOSIS — Z4789 Encounter for other orthopedic aftercare: Secondary | ICD-10-CM | POA: Diagnosis not present

## 2021-01-14 DIAGNOSIS — M25512 Pain in left shoulder: Secondary | ICD-10-CM | POA: Diagnosis not present

## 2021-01-14 DIAGNOSIS — R293 Abnormal posture: Secondary | ICD-10-CM | POA: Diagnosis not present

## 2021-01-14 DIAGNOSIS — M62512 Muscle wasting and atrophy, not elsewhere classified, left shoulder: Secondary | ICD-10-CM | POA: Diagnosis not present

## 2021-01-14 DIAGNOSIS — M25612 Stiffness of left shoulder, not elsewhere classified: Secondary | ICD-10-CM | POA: Diagnosis not present

## 2021-01-14 DIAGNOSIS — Z4789 Encounter for other orthopedic aftercare: Secondary | ICD-10-CM | POA: Diagnosis not present

## 2021-01-24 DIAGNOSIS — M25612 Stiffness of left shoulder, not elsewhere classified: Secondary | ICD-10-CM | POA: Diagnosis not present

## 2021-01-24 DIAGNOSIS — R293 Abnormal posture: Secondary | ICD-10-CM | POA: Diagnosis not present

## 2021-01-24 DIAGNOSIS — M62512 Muscle wasting and atrophy, not elsewhere classified, left shoulder: Secondary | ICD-10-CM | POA: Diagnosis not present

## 2021-01-24 DIAGNOSIS — Z4789 Encounter for other orthopedic aftercare: Secondary | ICD-10-CM | POA: Diagnosis not present

## 2021-01-24 DIAGNOSIS — M25512 Pain in left shoulder: Secondary | ICD-10-CM | POA: Diagnosis not present

## 2021-01-28 DIAGNOSIS — R293 Abnormal posture: Secondary | ICD-10-CM | POA: Diagnosis not present

## 2021-01-28 DIAGNOSIS — M25612 Stiffness of left shoulder, not elsewhere classified: Secondary | ICD-10-CM | POA: Diagnosis not present

## 2021-01-28 DIAGNOSIS — M62512 Muscle wasting and atrophy, not elsewhere classified, left shoulder: Secondary | ICD-10-CM | POA: Diagnosis not present

## 2021-01-28 DIAGNOSIS — Z4789 Encounter for other orthopedic aftercare: Secondary | ICD-10-CM | POA: Diagnosis not present

## 2021-01-28 DIAGNOSIS — M25512 Pain in left shoulder: Secondary | ICD-10-CM | POA: Diagnosis not present

## 2021-02-03 DIAGNOSIS — M62512 Muscle wasting and atrophy, not elsewhere classified, left shoulder: Secondary | ICD-10-CM | POA: Diagnosis not present

## 2021-02-03 DIAGNOSIS — R293 Abnormal posture: Secondary | ICD-10-CM | POA: Diagnosis not present

## 2021-02-03 DIAGNOSIS — M25612 Stiffness of left shoulder, not elsewhere classified: Secondary | ICD-10-CM | POA: Diagnosis not present

## 2021-02-03 DIAGNOSIS — Z4789 Encounter for other orthopedic aftercare: Secondary | ICD-10-CM | POA: Diagnosis not present

## 2021-02-03 DIAGNOSIS — M25512 Pain in left shoulder: Secondary | ICD-10-CM | POA: Diagnosis not present

## 2021-02-07 DIAGNOSIS — M25512 Pain in left shoulder: Secondary | ICD-10-CM | POA: Diagnosis not present

## 2021-02-07 DIAGNOSIS — R293 Abnormal posture: Secondary | ICD-10-CM | POA: Diagnosis not present

## 2021-02-07 DIAGNOSIS — Z4789 Encounter for other orthopedic aftercare: Secondary | ICD-10-CM | POA: Diagnosis not present

## 2021-02-07 DIAGNOSIS — M62512 Muscle wasting and atrophy, not elsewhere classified, left shoulder: Secondary | ICD-10-CM | POA: Diagnosis not present

## 2021-02-07 DIAGNOSIS — M25612 Stiffness of left shoulder, not elsewhere classified: Secondary | ICD-10-CM | POA: Diagnosis not present

## 2021-02-09 DIAGNOSIS — R293 Abnormal posture: Secondary | ICD-10-CM | POA: Diagnosis not present

## 2021-02-09 DIAGNOSIS — Z4789 Encounter for other orthopedic aftercare: Secondary | ICD-10-CM | POA: Diagnosis not present

## 2021-02-09 DIAGNOSIS — M25512 Pain in left shoulder: Secondary | ICD-10-CM | POA: Diagnosis not present

## 2021-02-09 DIAGNOSIS — M62512 Muscle wasting and atrophy, not elsewhere classified, left shoulder: Secondary | ICD-10-CM | POA: Diagnosis not present

## 2021-02-09 DIAGNOSIS — M25612 Stiffness of left shoulder, not elsewhere classified: Secondary | ICD-10-CM | POA: Diagnosis not present

## 2021-02-16 DIAGNOSIS — Z4789 Encounter for other orthopedic aftercare: Secondary | ICD-10-CM | POA: Diagnosis not present

## 2021-02-16 DIAGNOSIS — M25512 Pain in left shoulder: Secondary | ICD-10-CM | POA: Diagnosis not present

## 2021-02-16 DIAGNOSIS — R293 Abnormal posture: Secondary | ICD-10-CM | POA: Diagnosis not present

## 2021-02-16 DIAGNOSIS — M25612 Stiffness of left shoulder, not elsewhere classified: Secondary | ICD-10-CM | POA: Diagnosis not present

## 2021-02-16 DIAGNOSIS — M62512 Muscle wasting and atrophy, not elsewhere classified, left shoulder: Secondary | ICD-10-CM | POA: Diagnosis not present

## 2021-02-21 DIAGNOSIS — M62512 Muscle wasting and atrophy, not elsewhere classified, left shoulder: Secondary | ICD-10-CM | POA: Diagnosis not present

## 2021-02-21 DIAGNOSIS — Z4789 Encounter for other orthopedic aftercare: Secondary | ICD-10-CM | POA: Diagnosis not present

## 2021-02-21 DIAGNOSIS — M25512 Pain in left shoulder: Secondary | ICD-10-CM | POA: Diagnosis not present

## 2021-02-21 DIAGNOSIS — R293 Abnormal posture: Secondary | ICD-10-CM | POA: Diagnosis not present

## 2021-02-21 DIAGNOSIS — M25612 Stiffness of left shoulder, not elsewhere classified: Secondary | ICD-10-CM | POA: Diagnosis not present

## 2021-02-23 DIAGNOSIS — M25612 Stiffness of left shoulder, not elsewhere classified: Secondary | ICD-10-CM | POA: Diagnosis not present

## 2021-02-23 DIAGNOSIS — M62512 Muscle wasting and atrophy, not elsewhere classified, left shoulder: Secondary | ICD-10-CM | POA: Diagnosis not present

## 2021-02-23 DIAGNOSIS — M25512 Pain in left shoulder: Secondary | ICD-10-CM | POA: Diagnosis not present

## 2021-02-23 DIAGNOSIS — Z4789 Encounter for other orthopedic aftercare: Secondary | ICD-10-CM | POA: Diagnosis not present

## 2021-02-23 DIAGNOSIS — R293 Abnormal posture: Secondary | ICD-10-CM | POA: Diagnosis not present

## 2021-03-01 DIAGNOSIS — Z4789 Encounter for other orthopedic aftercare: Secondary | ICD-10-CM | POA: Diagnosis not present

## 2021-03-01 DIAGNOSIS — M62512 Muscle wasting and atrophy, not elsewhere classified, left shoulder: Secondary | ICD-10-CM | POA: Diagnosis not present

## 2021-03-01 DIAGNOSIS — M25612 Stiffness of left shoulder, not elsewhere classified: Secondary | ICD-10-CM | POA: Diagnosis not present

## 2021-03-01 DIAGNOSIS — R293 Abnormal posture: Secondary | ICD-10-CM | POA: Diagnosis not present

## 2021-03-01 DIAGNOSIS — M25512 Pain in left shoulder: Secondary | ICD-10-CM | POA: Diagnosis not present

## 2021-03-03 DIAGNOSIS — M62512 Muscle wasting and atrophy, not elsewhere classified, left shoulder: Secondary | ICD-10-CM | POA: Diagnosis not present

## 2021-03-03 DIAGNOSIS — Z4789 Encounter for other orthopedic aftercare: Secondary | ICD-10-CM | POA: Diagnosis not present

## 2021-03-03 DIAGNOSIS — M25612 Stiffness of left shoulder, not elsewhere classified: Secondary | ICD-10-CM | POA: Diagnosis not present

## 2021-03-03 DIAGNOSIS — M25512 Pain in left shoulder: Secondary | ICD-10-CM | POA: Diagnosis not present

## 2021-03-03 DIAGNOSIS — R293 Abnormal posture: Secondary | ICD-10-CM | POA: Diagnosis not present

## 2021-03-14 DIAGNOSIS — M62512 Muscle wasting and atrophy, not elsewhere classified, left shoulder: Secondary | ICD-10-CM | POA: Diagnosis not present

## 2021-03-14 DIAGNOSIS — Z4789 Encounter for other orthopedic aftercare: Secondary | ICD-10-CM | POA: Diagnosis not present

## 2021-03-14 DIAGNOSIS — M25512 Pain in left shoulder: Secondary | ICD-10-CM | POA: Diagnosis not present

## 2021-03-14 DIAGNOSIS — M25612 Stiffness of left shoulder, not elsewhere classified: Secondary | ICD-10-CM | POA: Diagnosis not present

## 2021-03-14 DIAGNOSIS — R293 Abnormal posture: Secondary | ICD-10-CM | POA: Diagnosis not present

## 2021-03-21 DIAGNOSIS — Z4789 Encounter for other orthopedic aftercare: Secondary | ICD-10-CM | POA: Diagnosis not present

## 2021-03-21 DIAGNOSIS — M25612 Stiffness of left shoulder, not elsewhere classified: Secondary | ICD-10-CM | POA: Diagnosis not present

## 2021-03-21 DIAGNOSIS — R293 Abnormal posture: Secondary | ICD-10-CM | POA: Diagnosis not present

## 2021-03-21 DIAGNOSIS — M25512 Pain in left shoulder: Secondary | ICD-10-CM | POA: Diagnosis not present

## 2021-03-21 DIAGNOSIS — M62512 Muscle wasting and atrophy, not elsewhere classified, left shoulder: Secondary | ICD-10-CM | POA: Diagnosis not present

## 2021-04-07 DIAGNOSIS — M25512 Pain in left shoulder: Secondary | ICD-10-CM | POA: Diagnosis not present

## 2021-04-07 DIAGNOSIS — R293 Abnormal posture: Secondary | ICD-10-CM | POA: Diagnosis not present

## 2021-04-07 DIAGNOSIS — M62512 Muscle wasting and atrophy, not elsewhere classified, left shoulder: Secondary | ICD-10-CM | POA: Diagnosis not present

## 2021-04-07 DIAGNOSIS — Z4789 Encounter for other orthopedic aftercare: Secondary | ICD-10-CM | POA: Diagnosis not present

## 2021-04-07 DIAGNOSIS — M25612 Stiffness of left shoulder, not elsewhere classified: Secondary | ICD-10-CM | POA: Diagnosis not present

## 2021-04-21 DIAGNOSIS — M25612 Stiffness of left shoulder, not elsewhere classified: Secondary | ICD-10-CM | POA: Diagnosis not present

## 2021-04-21 DIAGNOSIS — M25512 Pain in left shoulder: Secondary | ICD-10-CM | POA: Diagnosis not present

## 2021-04-21 DIAGNOSIS — Z4789 Encounter for other orthopedic aftercare: Secondary | ICD-10-CM | POA: Diagnosis not present

## 2021-04-21 DIAGNOSIS — R293 Abnormal posture: Secondary | ICD-10-CM | POA: Diagnosis not present

## 2021-04-21 DIAGNOSIS — M62512 Muscle wasting and atrophy, not elsewhere classified, left shoulder: Secondary | ICD-10-CM | POA: Diagnosis not present

## 2021-06-22 DIAGNOSIS — D225 Melanocytic nevi of trunk: Secondary | ICD-10-CM | POA: Diagnosis not present

## 2021-06-22 DIAGNOSIS — L578 Other skin changes due to chronic exposure to nonionizing radiation: Secondary | ICD-10-CM | POA: Diagnosis not present
